# Patient Record
Sex: Female | Born: 1940 | Race: White | Hispanic: No | Marital: Married | Smoking: Former smoker
Health system: Southern US, Community
[De-identification: ages and names within clinical notes are randomized; demographics above are authoritative.]

## PROBLEM LIST (undated history)

## (undated) DIAGNOSIS — R197 Diarrhea, unspecified: Secondary | ICD-10-CM

## (undated) DIAGNOSIS — I480 Paroxysmal atrial fibrillation: Secondary | ICD-10-CM

## (undated) DIAGNOSIS — K529 Noninfective gastroenteritis and colitis, unspecified: Secondary | ICD-10-CM

## (undated) DIAGNOSIS — E876 Hypokalemia: Secondary | ICD-10-CM

## (undated) DIAGNOSIS — F1010 Alcohol abuse, uncomplicated: Secondary | ICD-10-CM

## (undated) DIAGNOSIS — F419 Anxiety disorder, unspecified: Secondary | ICD-10-CM

## (undated) DIAGNOSIS — I10 Essential (primary) hypertension: Secondary | ICD-10-CM

## (undated) DIAGNOSIS — I493 Ventricular premature depolarization: Secondary | ICD-10-CM

## (undated) DIAGNOSIS — E785 Hyperlipidemia, unspecified: Secondary | ICD-10-CM

## (undated) DIAGNOSIS — I491 Atrial premature depolarization: Secondary | ICD-10-CM

## (undated) DIAGNOSIS — K52831 Collagenous colitis: Secondary | ICD-10-CM

## (undated) DIAGNOSIS — M1990 Unspecified osteoarthritis, unspecified site: Secondary | ICD-10-CM

## (undated) HISTORY — DX: Ventricular premature depolarization: I49.3

## (undated) HISTORY — DX: Essential (primary) hypertension: I10

## (undated) HISTORY — DX: Alcohol abuse, uncomplicated: F10.10

## (undated) HISTORY — DX: Noninfective gastroenteritis and colitis, unspecified: K52.9

## (undated) HISTORY — PX: ABDOMINAL HYSTERECTOMY: SHX81

## (undated) HISTORY — DX: Collagenous colitis: K52.831

## (undated) HISTORY — PX: CHOLECYSTECTOMY: SHX55

## (undated) HISTORY — DX: Diarrhea, unspecified: R19.7

## (undated) HISTORY — DX: Unspecified osteoarthritis, unspecified site: M19.90

## (undated) HISTORY — PX: VAGINAL HYSTERECTOMY: SHX2639

## (undated) HISTORY — DX: Hypokalemia: E87.6

## (undated) HISTORY — DX: Atrial premature depolarization: I49.1

## (undated) HISTORY — DX: Paroxysmal atrial fibrillation: I48.0

## (undated) HISTORY — DX: Hyperlipidemia, unspecified: E78.5

## (undated) HISTORY — DX: Anxiety disorder, unspecified: F41.9

---

## 2000-01-21 ENCOUNTER — Emergency Department (HOSPITAL_COMMUNITY): Admission: EM | Admit: 2000-01-21 | Discharge: 2000-01-21 | Payer: Self-pay | Admitting: Emergency Medicine

## 2000-05-22 ENCOUNTER — Encounter: Payer: Self-pay | Admitting: Family Medicine

## 2000-05-22 ENCOUNTER — Encounter: Admission: RE | Admit: 2000-05-22 | Discharge: 2000-05-22 | Payer: Self-pay | Admitting: Family Medicine

## 2001-07-23 ENCOUNTER — Encounter: Payer: Self-pay | Admitting: Internal Medicine

## 2001-08-16 ENCOUNTER — Emergency Department (HOSPITAL_COMMUNITY): Admission: EM | Admit: 2001-08-16 | Discharge: 2001-08-16 | Payer: Self-pay | Admitting: Emergency Medicine

## 2002-02-22 ENCOUNTER — Encounter: Payer: Self-pay | Admitting: Family Medicine

## 2002-02-22 ENCOUNTER — Encounter: Admission: RE | Admit: 2002-02-22 | Discharge: 2002-02-22 | Payer: Self-pay | Admitting: Family Medicine

## 2002-07-10 ENCOUNTER — Emergency Department (HOSPITAL_COMMUNITY): Admission: EM | Admit: 2002-07-10 | Discharge: 2002-07-10 | Payer: Self-pay | Admitting: Emergency Medicine

## 2003-11-11 ENCOUNTER — Ambulatory Visit (HOSPITAL_COMMUNITY): Admission: RE | Admit: 2003-11-11 | Discharge: 2003-11-11 | Payer: Self-pay | Admitting: *Deleted

## 2008-07-18 ENCOUNTER — Emergency Department (HOSPITAL_COMMUNITY): Admission: EM | Admit: 2008-07-18 | Discharge: 2008-07-19 | Payer: Self-pay | Admitting: Emergency Medicine

## 2008-07-25 ENCOUNTER — Encounter: Payer: Self-pay | Admitting: Internal Medicine

## 2009-06-19 ENCOUNTER — Encounter: Payer: Self-pay | Admitting: Internal Medicine

## 2009-06-19 ENCOUNTER — Observation Stay (HOSPITAL_COMMUNITY): Admission: EM | Admit: 2009-06-19 | Discharge: 2009-06-20 | Payer: Self-pay | Admitting: Emergency Medicine

## 2009-06-27 ENCOUNTER — Encounter: Payer: Self-pay | Admitting: Internal Medicine

## 2011-04-01 LAB — CBC
HCT: 39.8 % (ref 36.0–46.0)
Hemoglobin: 14 g/dL (ref 12.0–15.0)
MCHC: 35.2 g/dL (ref 30.0–36.0)
MCV: 95.1 fL (ref 78.0–100.0)
MCV: 95.2 fL (ref 78.0–100.0)
Platelets: 177 10*3/uL (ref 150–400)
Platelets: 192 10*3/uL (ref 150–400)
RBC: 4.19 MIL/uL (ref 3.87–5.11)
RDW: 12.4 % (ref 11.5–15.5)
RDW: 12.5 % (ref 11.5–15.5)
WBC: 4.4 10*3/uL (ref 4.0–10.5)
WBC: 4.6 10*3/uL (ref 4.0–10.5)

## 2011-04-01 LAB — DIFFERENTIAL
Basophils Absolute: 0 10*3/uL (ref 0.0–0.1)
Basophils Relative: 0 % (ref 0–1)
Eosinophils Absolute: 0.2 10*3/uL (ref 0.0–0.7)
Eosinophils Relative: 3 % (ref 0–5)
Lymphocytes Relative: 32 % (ref 12–46)
Lymphs Abs: 1.5 10*3/uL (ref 0.7–4.0)
Monocytes Absolute: 0.3 10*3/uL (ref 0.1–1.0)
Monocytes Relative: 7 % (ref 3–12)
Neutro Abs: 2.7 10*3/uL (ref 1.7–7.7)
Neutrophils Relative %: 57 % (ref 43–77)

## 2011-04-01 LAB — COMPREHENSIVE METABOLIC PANEL
AST: 20 U/L (ref 0–37)
Albumin: 3.5 g/dL (ref 3.5–5.2)
Chloride: 106 mEq/L (ref 96–112)
Creatinine, Ser: 0.56 mg/dL (ref 0.4–1.2)
GFR calc Af Amer: >60 mL/min (ref >60)
Potassium: 3.9 mEq/L (ref 3.5–5.1)
Total Bilirubin: 0.7 mg/dL (ref 0.3–1.2)
Total Protein: 5.7 g/dL — ABNORMAL LOW (ref 6.0–8.3)

## 2011-04-01 LAB — LIPID PANEL
Cholesterol: 233 mg/dL — ABNORMAL HIGH (ref 0–200)
HDL: 69 mg/dL (ref >39)

## 2011-04-01 LAB — POCT I-STAT, CHEM 8
Hemoglobin: 13.9 g/dL (ref 12.0–15.0)
Sodium: 137 mEq/L (ref 135–145)
TCO2: 26 mmol/L (ref 0–100)

## 2011-04-01 LAB — CARDIAC PANEL(CRET KIN+CKTOT+MB+TROPI)
CK, MB: 1.1 ng/mL (ref 0.3–4.0)
Relative Index: INVALID (ref 0.0–2.5)
Total CK: 34 U/L (ref 7–177)
Troponin I: 0.02 ng/mL (ref 0.00–0.06)

## 2011-04-01 LAB — POCT CARDIAC MARKERS: Myoglobin, poc: 54.9 ng/mL (ref 12–200)

## 2011-04-01 LAB — APTT: aPTT: 28 seconds (ref 24–37)

## 2011-04-01 LAB — TSH
TSH: 3.048 u[IU]/mL (ref 0.350–4.500)
TSH: 4.184 u[IU]/mL (ref 0.350–4.500)

## 2011-04-01 LAB — TROPONIN I: Troponin I: 0.02 ng/mL (ref 0.00–0.06)

## 2011-04-01 LAB — CK TOTAL AND CKMB (NOT AT ARMC)
Relative Index: INVALID (ref 0.0–2.5)
Total CK: 49 U/L (ref 7–177)

## 2011-04-01 LAB — PROTIME-INR: Prothrombin Time: 14 seconds (ref 11.6–15.2)

## 2011-04-01 LAB — MAGNESIUM: Magnesium: 2.3 mg/dL (ref 1.5–2.5)

## 2011-05-07 ENCOUNTER — Telehealth: Payer: Self-pay | Admitting: Gastroenterology

## 2011-05-07 ENCOUNTER — Ambulatory Visit: Payer: Self-pay | Admitting: Gastroenterology

## 2011-05-07 NOTE — Telephone Encounter (Signed)
Gwendolyn [name] patient for no show today .

## 2011-05-07 NOTE — H&P (Signed)
NAMEDANISA, KOPEC                ACCOUNT NO.:  [id number]   MEDICAL RECORD NO.:  [id number]          PATIENT TYPE:  EMS   LOCATION:  [facility]                         FACILITY:  [facility]   PHYSICIAN:  Michiel Cowboy, MDDATE OF BIRTH:  01-29-1941   DATE OF ADMISSION:  06/19/2009  DATE OF DISCHARGE:                              HISTORY & PHYSICAL   PRIMARY CARE Ellawyn Wogan:  Dr. Abigail Miyamoto.   CARDIOLOGIST:  Dr. Amil Amen.   CHIEF COMPLAINT:  Abnormal EKG.   The patient is a 70 year old female with past medical history  significant for atrial fibrillation, hypertension and hyperlipidemia.  The patient was evaluated today in the office prior to initiation of an  exercise program.  As part of this evaluation, she had undergone an EKG  that showed sinus bradycardia with frequent PACs and heart rate of 56.  Her primary care Sahaana Weitman was somewhat concerned because the patient  states that she is kind of feeling slightly lightheaded and not quite  herself and is having palpitations.  She called the cardiology office,  who recommended further monitoring at [place] the patient was sent  down to the emergency department.  ED called the hospitalist on-call for  admission.  The patient states that she has always been lightheaded,  particularly when she gets up, but she has been having trouble with  palpitations almost daily.  She has been on this dose of Toprol for a  prolonged period time and she says that if it is changed she usually  develops paroxysmal atrial fibrillation.   Otherwise, review of systems completely unremarkable.  She has not had  any chest pain, no shortness of breath, no wheezing, no coughing.  Otherwise, she has been feeling fairly well.   Review of systems otherwise unremarkable.   PAST MEDICAL HISTORY:  1. Paroxysmal atrial fibrillation.  2. Hypertension.  3. Chronic anxiety.  4. Hyperlipidemia, diet controlled.   SOCIAL HISTORY:  The patient does not smoke, drinks  occasionally.   FAMILY HISTORY:  Noncontributory.   ALLERGIES:  1. CODEINE.  2. ERYTHROMYCIN.  3. PENICILLIN.  4. SULFA.   MEDICATIONS:  1. Toprol XL 400 mg once a day.  2. Triamterene 37.5/25 mg p.o. daily.  3. Folic acid daily.  4. Aspirin 81 mg daily.  5. Potassium 10 mEq daily.   PHYSICAL EXAMINATION:  VITAL SIGNS:  Temperature 97.1, blood pressure  132/57, pulse 60, respirations 16.  Saturating 100% on room air.  GENERAL:  The patient appears to be in no acute distress.  HEENT:  Head nontraumatic.  Moist mucous membranes.  LUNGS:  Clear to auscultation bilaterally.  HEART:  Regular rate and rhythm.  No murmurs, rubs or gallops.  ABDOMEN:  Soft, nontender, nondistended.  LOWER EXTREMITIES:  Without clubbing, cyanosis or edema.  NEUROLOGIC:  The patient is intact.  SKIN:  Clean, dry and intact.   LABORATORY DATA:  White blood cell count 4.6, hemoglobin 13.9, sodium  137, potassium 3.6, creatinine 0.71.  Cardiac enzymes negative.  Chest x-  ray unremarkable.  EKG showing sinus bradycardia with frequent PACs,  rate 58.  ASSESSMENT/PLAN:  This is a 70 year old female with history of  paroxysmal atrial fibrillation, currently in sinus brady with  symptomatic PACs.   1. History of paroxysmal atrial fibrillation.  Continue her Toprol.      She says that Dr. Amil Amen advised against changing the doses and      she responds poorly to adjustment.  So far, I do not think she is      having symptomatology from bradycardia, but rather the occasional      PACs.  We will continue to watch her on telemetry.  Check TSH.  We      will recommend a cardiology consult to see what else they want to      do to the patient while she is here.  She may need an outpatient      Holter monitor.  Reportedly, she had an echogram done just in      December and will not repeat at this point.  2. History of hypertension.  Continue triamterine and      hydrochlorothiazide.  3. Sensation of  lightheadedness when she stands up.  We will check      orthostatics.  Also sensation of overall being unwell and frequent      palpitations.  Watch in telemetry and cycle cardiac markers.  4. Prophylaxis.  Lovenox, good p.o. intake.  The patient is going to      be observed for 24 hours.  5. Low potassium.  We will replace up to 4.0.      Michiel Cowboy, MD  Electronically Signed     AVD/MEDQ  D:  06/19/2009  T:  06/20/2009  Job:  [id number]   cc:   Chales Salmon. Abigail Miyamoto, M.D.

## 2011-06-13 ENCOUNTER — Encounter: Payer: Self-pay | Admitting: Gastroenterology

## 2011-06-13 ENCOUNTER — Other Ambulatory Visit (INDEPENDENT_AMBULATORY_CARE_PROVIDER_SITE_OTHER): Payer: Medicare Other

## 2011-06-13 ENCOUNTER — Ambulatory Visit (INDEPENDENT_AMBULATORY_CARE_PROVIDER_SITE_OTHER): Payer: Medicare Other | Admitting: Gastroenterology

## 2011-06-13 VITALS — BP 128/64 | HR 80 | Ht 65.0 in | Wt 173.0 lb

## 2011-06-13 DIAGNOSIS — R195 Other fecal abnormalities: Secondary | ICD-10-CM

## 2011-06-13 DIAGNOSIS — R6889 Other general symptoms and signs: Secondary | ICD-10-CM

## 2011-06-13 DIAGNOSIS — D509 Iron deficiency anemia, unspecified: Secondary | ICD-10-CM | POA: Insufficient documentation

## 2011-06-13 DIAGNOSIS — F341 Dysthymic disorder: Secondary | ICD-10-CM

## 2011-06-13 DIAGNOSIS — R197 Diarrhea, unspecified: Secondary | ICD-10-CM | POA: Insufficient documentation

## 2011-06-13 DIAGNOSIS — F329 Major depressive disorder, single episode, unspecified: Secondary | ICD-10-CM

## 2011-06-13 LAB — CBC WITH DIFFERENTIAL/PLATELET
Basophils Relative: 0.6 % (ref 0.0–3.0)
Eosinophils Absolute: 0.2 10*3/uL (ref 0.0–0.7)
Eosinophils Relative: 3.5 % (ref 0.0–5.0)
Hemoglobin: 13.6 g/dL (ref 12.0–15.0)
Lymphocytes Relative: 19.6 % (ref 12.0–46.0)
MCHC: 34.6 g/dL (ref 30.0–36.0)
Monocytes Relative: 8.1 % (ref 3.0–12.0)
Neutro Abs: 3.5 10*3/uL (ref 1.4–7.7)
Neutrophils Relative %: 68.2 % (ref 43.0–77.0)
RBC: 4.13 Mil/uL (ref 3.87–5.11)
WBC: 5.1 10*3/uL (ref 4.5–10.5)

## 2011-06-13 LAB — HEPATIC FUNCTION PANEL
ALT: 31 U/L (ref 0–35)
Albumin: 3.7 g/dL (ref 3.5–5.2)
Bilirubin, Direct: 0.2 mg/dL (ref 0.0–0.3)
Total Protein: 6 g/dL (ref 6.0–8.3)

## 2011-06-13 LAB — BASIC METABOLIC PANEL
CO2: 30 mEq/L (ref 19–32)
Calcium: 9 mg/dL (ref 8.4–10.5)
Creatinine, Ser: 0.4 mg/dL (ref 0.4–1.2)
GFR: 163.64 mL/min (ref >60.00)
Glucose, Bld: 98 mg/dL (ref 70–99)
Sodium: 142 mEq/L (ref 135–145)

## 2011-06-13 LAB — VITAMIN B12: Vitamin B-12: 229 pg/mL (ref 211–911)

## 2011-06-13 LAB — FOLATE: Folate: >24.8 ng/mL (ref >5.9)

## 2011-06-13 MED ORDER — PEG-KCL-NACL-NASULF-NA ASC-C 100 G PO SOLR: 1.0000 | Freq: Once | ORAL | Status: DC

## 2011-06-13 MED ORDER — COLESTIPOL HCL 1 G PO TABS: 1.0000 g | ORAL_TABLET | Freq: Every day | ORAL | Status: DC

## 2011-06-13 MED ORDER — MESALAMINE 1.2 G PO TBEC: DELAYED_RELEASE_TABLET | ORAL | Status: DC

## 2011-06-13 NOTE — Progress Notes (Signed)
History of Present Illness:  This is a very pleasant 71 year old Caucasian female referred by Dr.Nnodi Eagle family care at [facility] area at this patient has had 2-1/2 months of watery diarrhea without rectal bleeding or significant abdominal pain. Her bowel movements are urgent and explosive and do occur at night. In her mind, her diarrhea is related to eating Mussels. However, there is no known history of food poisoning and other family members or friends. Patient is status post cholecystectomy and has had some mild diarrhea for years. Also noted, is the recent death of her husband about the time her diarrhea started. She has had some mild depression but no hallucinations or delusions. He denies upper GI complaints, but is on daily aspirin therapy. There is no history of hepatitis, pancreatitis, or hepatobiliary symptoms. Over the last 10 years the patient has voluntarily lost 70 pounds in weight .however, she denies any specific food intolerances. There is no family history of celiac disease. Also, the patient does complain of gas, bloating, and on questioning uses a large amount of by mouth sorbitol. Past medical history is remarkable for atrial fibrillation managed with beta blocker therapy. The patient denies infectious disease exposure or recent antibiotics. She has not had previous colonoscopy or endoscopy exams.    I have reviewed this patient's present history, medical and surgical past history, allergies and medications.     ROS: The remainder of the 10 point ROS is negative.. she does complain of low-grade fever, recent anxiety and depression, chronic low back pain, periodic palpitations, night sweats, chronic insomnia, and nonspecific edema of her lower tremors. She recently was found to be potassium deficient and has been started on potassium replacement therapy.   Past Medical History  Diagnosis Date  . Hypertension   . Afib   . PVC's (premature ventricular contractions)   . PAC  (premature atrial contraction)   . Anxiety   . Alcohol abuse   . Diarrhea   . Hypopotassemia   . Hyperlipemia    Past Surgical History  Procedure Date  . Cholecystectomy   . Vaginal hysterectomy     reports that she has quit smoking. She has never used smokeless tobacco. She reports that she drinks alcohol. She reports that she does not use illicit drugs. family history includes Heart disease in her maternal grandmother.  There is no history of Colon cancer. Allergies  Allergen Reactions  . Codeine   . Erythromycin   . Penicillins   . Sulfa Antibiotics       Physical Exam: General well developed well nourished patient in no acute distress, appearing her stated age Eyes PERRLA, no icterus, fundoscopic exam per opthamologist Skin no lesions noted Neck supple, no adenopathy, no thyroid enlargement, no tenderness Chest clear to percussion and auscultation Heart no significant murmurs, gallops or rubs noted Abdomen no hepatosplenomegaly masses or tenderness, BS normal.  Rectal inspection normal no fissures, or fistulae noted.  No masses or tenderness on digital exam. Stool guaiac +. Neurologic patient oriented x 3, cranial nerves intact, no focal neurologic deficits noted. Psychological mental status normal and normal affect.  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 This sounds like rather classical irritable bowel syndrome him a that patient does have a guaiac positive stool suggestive of underlying inflammatory colitis. I have scheduled her for diagnostic colonoscopy after we have checked stool culture, C. difficile  exam, exam for O&P, and repeat screening labs including celiac antibodies. I have placed her on aminosalicylate therapy with Lialda 2.4 g a day, Colestid 1 g in mid morning, and a low fiber diet. Anemia profile  also ordered today.  Encounter Diagnoses  Name Primary?  . Diarrhea Yes  . Iron deficiency anemia, unspecified    . Other general symptoms

## 2011-06-13 NOTE — Patient Instructions (Addendum)
Your prescription(s) have been sent to you pharmacy.  Your procedure has been scheduled for 07/22/2011, please follow the seperate instructions.  Please go to the basement today for your labs.  Today you were given an handout on artificial sweeteners to avoid please follow. Low fiber diet given today

## 2011-06-14 ENCOUNTER — Telehealth: Payer: Self-pay | Admitting: *Deleted

## 2011-06-14 LAB — CELIAC PANEL 10
Endomysial Screen: NEGATIVE
Gliadin IgA: 2.9 U/mL (ref <20)
Gliadin IgG: 4.2 U/mL (ref <20)
Tissue Transglut Ab: 5.9 U/mL (ref <20)
Tissue Transglutaminase Ab, IgA: 4.7 U/mL (ref <20)

## 2011-06-14 NOTE — Telephone Encounter (Signed)
Message copied by Leonette Monarch on Fri Jun 14, 2011 12:21 PM ------      Message from: Jarold Motto, DAVID R      Created: Fri Jun 14, 2011 10:35 AM       Restart B12 shots and treatment ASAP.

## 2011-06-17 ENCOUNTER — Other Ambulatory Visit: Payer: Medicare Other

## 2011-06-17 ENCOUNTER — Other Ambulatory Visit: Payer: Self-pay | Admitting: Gastroenterology

## 2011-06-17 ENCOUNTER — Ambulatory Visit (INDEPENDENT_AMBULATORY_CARE_PROVIDER_SITE_OTHER): Payer: Medicare Other | Admitting: Gastroenterology

## 2011-06-17 DIAGNOSIS — E538 Deficiency of other specified B group vitamins: Secondary | ICD-10-CM

## 2011-06-17 DIAGNOSIS — D509 Iron deficiency anemia, unspecified: Secondary | ICD-10-CM

## 2011-06-17 DIAGNOSIS — R197 Diarrhea, unspecified: Secondary | ICD-10-CM

## 2011-06-17 MED ORDER — CYANOCOBALAMIN 1000 MCG/ML IJ SOLN
1000.0000 ug | INTRAMUSCULAR | Status: AC
  Administered 2011-08-06 – 2012-06-16 (×9): 1000 ug via INTRAMUSCULAR

## 2011-06-17 MED ORDER — CYANOCOBALAMIN 1000 MCG/ML IJ SOLN
1000.0000 ug | INTRAMUSCULAR | Status: AC
  Administered 2011-06-17 – 2011-07-03 (×3): 1000 ug via INTRAMUSCULAR

## 2011-06-17 NOTE — Telephone Encounter (Signed)
Pt aware and she would like to be added for b12 today

## 2011-06-17 NOTE — Progress Notes (Signed)
Patient here for a B12 injection

## 2011-06-19 LAB — CLOSTRIDIUM DIFFICILE BY PCR: Toxigenic C. Difficile by PCR: NOT DETECTED

## 2011-06-24 ENCOUNTER — Ambulatory Visit (INDEPENDENT_AMBULATORY_CARE_PROVIDER_SITE_OTHER): Payer: Medicare Other | Admitting: Gastroenterology

## 2011-06-24 DIAGNOSIS — E538 Deficiency of other specified B group vitamins: Secondary | ICD-10-CM

## 2011-06-28 ENCOUNTER — Telehealth: Payer: Self-pay | Admitting: Gastroenterology

## 2011-06-28 NOTE — Telephone Encounter (Signed)
lmom for pt to call back. Stools for CDIFF, O&P, and CULTURE were all negative. She may call back if she still has problems.

## 2011-07-03 ENCOUNTER — Ambulatory Visit (INDEPENDENT_AMBULATORY_CARE_PROVIDER_SITE_OTHER): Payer: Medicare Other | Admitting: Gastroenterology

## 2011-07-03 DIAGNOSIS — E538 Deficiency of other specified B group vitamins: Secondary | ICD-10-CM

## 2011-07-03 NOTE — Telephone Encounter (Signed)
Pt did not return call; assume she got my message with results.

## 2011-07-22 ENCOUNTER — Encounter: Payer: Self-pay | Admitting: Gastroenterology

## 2011-07-22 ENCOUNTER — Ambulatory Visit (AMBULATORY_SURGERY_CENTER): Payer: Medicare Other | Admitting: Gastroenterology

## 2011-07-22 DIAGNOSIS — K5730 Diverticulosis of large intestine without perforation or abscess without bleeding: Secondary | ICD-10-CM

## 2011-07-22 DIAGNOSIS — R195 Other fecal abnormalities: Secondary | ICD-10-CM

## 2011-07-22 DIAGNOSIS — K5289 Other specified noninfective gastroenteritis and colitis: Secondary | ICD-10-CM

## 2011-07-22 DIAGNOSIS — R197 Diarrhea, unspecified: Secondary | ICD-10-CM

## 2011-07-22 DIAGNOSIS — D509 Iron deficiency anemia, unspecified: Secondary | ICD-10-CM

## 2011-07-22 MED ORDER — SODIUM CHLORIDE 0.9 % IV SOLN: 500.0000 mL | INTRAVENOUS | Status: DC

## 2011-07-22 NOTE — Progress Notes (Signed)
PATIENT VERY ANXIOUS ABOUT EXAM D/T HER A FIB HISTORY. NATALIE,RN NOTIFIED. Sherren Kerns

## 2011-07-22 NOTE — Patient Instructions (Signed)
Blue and Green Discharge instructions reviewed with patient and care partner.  Blue discharge instructions signed by care partner.  Impressions/Recommendations:  Diverticulosis (handout given as well as high fiber diet handout)  Continue medications as you were taking them prior to coming in for your procedure.  Your heart rhythm was irregular at times today, as per your request, an appointment was made for with Dr. Hillis Range, [facility] on August 15th at 1200. You should request any prior cardiology records to be sent to his office prior to your appointment.

## 2011-07-23 ENCOUNTER — Other Ambulatory Visit: Payer: Self-pay

## 2011-07-23 ENCOUNTER — Telehealth: Payer: Self-pay

## 2011-07-23 DIAGNOSIS — R197 Diarrhea, unspecified: Secondary | ICD-10-CM

## 2011-07-23 MED ORDER — DIPHENOXYLATE-ATROPINE 2.5-0.025 MG PO TABS: 1.0000 | ORAL_TABLET | Freq: Three times a day (TID) | ORAL | Status: DC | PRN

## 2011-07-23 NOTE — Telephone Encounter (Signed)
I left a message on the pt's hm am to call if any questions or concerns.  MAW

## 2011-07-24 ENCOUNTER — Telehealth: Payer: Self-pay | Admitting: Gastroenterology

## 2011-07-24 NOTE — Telephone Encounter (Signed)
Notified pt Gwendolyn [name] wants her to follow a Low Fiber Diet; pt stated understanding.

## 2011-07-24 NOTE — Telephone Encounter (Signed)
OV 06/13/11 for diarrhea > 2 months- worse since her husband died. Hx Iron Def. Anemia, mild bile salt enteropathy from previous cholecystectomy. Pt placed on low fiber diet, instructed to avoid artificial sweeteners.She was placed on Lialda and Colestid. Pt had COLON 07/22/11 showing mild diverticulosis- path not back, stools and CDiff negative. Pt instructed to be on a High Fiber diet. Pt reports she still has 3-4 loose, watery stools daily. She stated she never went on the Lialda because of the side effects and she never started Colestid because her PCP has her on MOTOFEN for diarrhea. What diet does she need to be on? Thanks.

## 2011-07-24 NOTE — Telephone Encounter (Signed)
LOW FIBER DIET

## 2011-07-26 ENCOUNTER — Encounter: Payer: Self-pay | Admitting: Gastroenterology

## 2011-07-26 ENCOUNTER — Telehealth: Payer: Self-pay | Admitting: *Deleted

## 2011-07-26 MED ORDER — BUDESONIDE 3 MG PO CP24: ORAL_CAPSULE | ORAL | Status: DC

## 2011-07-26 NOTE — Telephone Encounter (Signed)
Message copied by Florene Glen on Fri Jul 26, 2011  4:36 PM ------      Message from: PATTERSON, DAVID R      Created: Fri Jul 26, 2011  4:32 PM       She has collagenous colitis and we need to start Entocort 9 mg a day ASAP and see me in one month.

## 2011-07-26 NOTE — Telephone Encounter (Signed)
Message copied by Florene Glen on Fri Jul 26, 2011  4:35 PM ------      Message from: PATTERSON, DAVID R      Created: Fri Jul 26, 2011  4:32 PM       She has collagenous colitis and we need to start Entocort 9 mg a day ASAP and see me in one month.

## 2011-07-26 NOTE — Telephone Encounter (Addendum)
lmom that per Dr Jarold Motto, she has Collangenous Colitis. She needs to stop other drugs- Lilada and Colestid- and begin this. (per my note from yesterday, pt did not start these drugs). She needs to start Entocort 3 tabs daily until she sees Dr Jarold Motto in 1 month. I will call her with an appt on Monday. Scheduled pt's appt for 08/23/11 at 09:45am.

## 2011-07-29 NOTE — Telephone Encounter (Signed)
Spoke with pt who stated she got my message and the drug. Wants to make sure it won't interfere with her AFib. Called pharmacist at [facility] who could find no interaction between Metoprolol and Entocort. Informed  Pt of findings and changed her f/u appt d/t her being out of town. Pt to call with problems.

## 2011-08-06 ENCOUNTER — Ambulatory Visit (INDEPENDENT_AMBULATORY_CARE_PROVIDER_SITE_OTHER): Payer: Medicare Other | Admitting: Gastroenterology

## 2011-08-06 DIAGNOSIS — E538 Deficiency of other specified B group vitamins: Secondary | ICD-10-CM

## 2011-08-07 ENCOUNTER — Ambulatory Visit (INDEPENDENT_AMBULATORY_CARE_PROVIDER_SITE_OTHER): Payer: Medicare Other | Admitting: Internal Medicine

## 2011-08-07 ENCOUNTER — Encounter: Payer: Self-pay | Admitting: Internal Medicine

## 2011-08-07 VITALS — BP 118/66 | HR 63 | Ht 65.0 in | Wt 169.0 lb

## 2011-08-07 DIAGNOSIS — I10 Essential (primary) hypertension: Secondary | ICD-10-CM

## 2011-08-07 DIAGNOSIS — I4949 Other premature depolarization: Secondary | ICD-10-CM

## 2011-08-07 DIAGNOSIS — I4891 Unspecified atrial fibrillation: Secondary | ICD-10-CM | POA: Insufficient documentation

## 2011-08-07 DIAGNOSIS — I493 Ventricular premature depolarization: Secondary | ICD-10-CM | POA: Insufficient documentation

## 2011-08-07 NOTE — Assessment & Plan Note (Signed)
Controlled with toprol We will obtain an echo

## 2011-08-07 NOTE — Progress Notes (Signed)
Gwendolyn [name] is a pleasant 70 y.o. WF patient with a h/o paroxysmal atrial fibrillation who presents today to establish care.  She reports initially having atrial fibrillation 15 years ago.  She did well without recurrence until 3 years ago when she had a second episode of atrial fibrillation 7/09.  She states that her spouse was recovering from knee surgery.  In the setting of the stress of taking care of him she developed atrial fibrillation.  She reports associated SOB, anxiety, and "heart racing".   She went to [facility] ER and was observed.  Her afib spontaneously resolved.  She has been treated with toprol.  She thinks that she takes toprol XL 200mg  BID.  This is simliar to the dose she was taking on a H&P from 2010. She presented to her primary care physicians office 6/10 with bradycardia and PACs.   She was observed at [facility] and remained stable/ asymptomatic.  No changes were made at that time.  She has done well since.  She reports occasional palpitations which she attributes to PVCs.  Today, she denies symptoms of chest pain, fatigue, shortness of breath, orthopnea, PND, lower extremity edema,  presyncope, syncope, or neurologic sequela.  She has rare dizziness. The patient is tolerating medications without difficulties and is otherwise without complaint today.   Past Medical History  Diagnosis Date  . Hypertension   . Paroxysmal atrial fibrillation   . PVC's (premature ventricular contractions)   . PAC (premature atrial contraction)   . Anxiety   . Alcohol abuse   . Diarrhea   . Hypopotassemia   . Hyperlipemia   . Colitis   . DJD (degenerative joint disease)    Past Surgical History  Procedure Date  . Cholecystectomy   . Vaginal hysterectomy     Current Outpatient Prescriptions  Medication Sig Dispense Refill  . aspirin 81 MG tablet Take 81 mg by mouth daily.        . budesonide (ENTOCORT EC) 3 MG 24 hr capsule Take 3 pills daily by mouth daily.  90 capsule  0  .  Cholecalciferol (VITAMIN D) 1000 UNITS capsule Take 1,000 Units by mouth daily.        . Difenoxin-Atropine (MOTOFEN) 1-0.025 MG TABS Take one tablet by mouth after each loose stool every 3 hours as needed.       . diphenoxylate-atropine (LOMOTIL) 2.5-0.025 MG per tablet Take 1 tablet by mouth every 8 (eight) hours as needed for diarrhea/loose stools.  35 tablet  0  . folic acid (FOLVITE) 1 MG tablet Take 1 mg by mouth daily.        . magnesium oxide (MAG-OX) 400 MG tablet Take 400 mg by mouth daily.        . metoprolol (TOPROL-XL) 200 MG 24 hr tablet Take two tablets by mouth once a day       . potassium chloride (KLOR-CON) 10 MEQ CR tablet Take 20 mEq by mouth daily.       Marland Kitchen triamterene-hydrochlorothiazide (DYAZIDE) 37.5-25 MG per capsule Take 1 capsule by mouth every morning.        . vitamin C (ASCORBIC ACID) 500 MG tablet Take 500 mg by mouth daily.         Current Facility-Administered Medications  Medication Dose Route Frequency Provider Last Rate Last Dose  . 0.9 %  sodium chloride infusion  500 mL Intravenous Continuous Sheryn Bison, MD      . cyanocobalamin ((VITAMIN B-12)) injection 1,000 mcg  1,000  mcg Intramuscular Q30 days Sheryn Bison, MD   1,000 mcg at 08/06/11 1203    Allergies  Allergen Reactions  . Codeine   . Erythromycin   . Other     ALL MYCINS  . Penicillins   . Sulfa Antibiotics     History   Social History  . Marital Status: Married    Spouse Name: N/A    Number of Children: 2  . Years of Education: N/A   Occupational History  . retired    Social History Main Topics  . Smoking status: Former Smoker    Quit date: 08/06/1978  . Smokeless tobacco: Never Used  . Alcohol Use: 0.0 oz/week     2 drinks WINE daily, per Dr Amil Amen office note, prior heavy ETOH use  . Drug Use: No  . Sexually Active: Not on file   Other Topics Concern  . Not on file   Social History Narrative   Lives in [place].  Widowed x 3 months.    Family History    Problem Relation Age of Onset  . Colon cancer Neg Hx   . Heart disease Maternal Grandmother     ROS- All systems are reviewed and negative except as per the HPI above  Physical Exam: Filed Vitals:   08/07/11 1238  BP: 118/66  Pulse: 63  Height: 5\' 5"  (1.651 m)  Weight: 169 lb (76.658 kg)    GEN- The patient is well appearing, alert and oriented x 3 today.   Head- normocephalic, atraumatic Eyes-  Sclera clear, conjunctiva pink Ears- hearing intact Oropharynx- clear Neck- supple, no JVP Lymph- no cervical lymphadenopathy Lungs- Clear to ausculation bilaterally, normal work of breathing Heart- Regular rate and rhythm, no murmurs, rubs or gallops, PMI not laterally displaced GI- soft, NT, ND, + BS Extremities- no clubbing, cyanosis, or edema MS- no significant deformity or atrophy Skin- no rash or lesion Psych- euthymic mood, full affect Neuro- strength and sensation are intact  EKG today reveals sinus rhythm 63 bpm, otherwise normal ekg  GXT cardiolyte 07/23/01 [facility])- pt walked for 7 minutes 25 seconds, achieved HR 160bpm,  breast attenuation artifact, EF 58%, possible LCx ischemia  Echo 07/25/08- EF 53%, LA 47 mm, no significant valvular disease  Assessment and Plan:

## 2011-08-07 NOTE — Patient Instructions (Addendum)
Your physician has requested that you have an echocardiogram. Echocardiography is a painless test that uses sound waves to create images of your heart. It provides your doctor with information about the size and shape of your heart and how well your heart's chambers and valves are working. This procedure takes approximately one hour. There are no restrictions for this procedure.  Your physician recommends that you schedule a follow-up appointment in: 6 months with Dr. Johney Frame.

## 2011-08-07 NOTE — Assessment & Plan Note (Signed)
Stable No change required today  

## 2011-08-07 NOTE — Assessment & Plan Note (Signed)
Ms Vogelgesang presents today to establish care in the electrophysiology clinic for longterm management of afib. Her afib appears to be well controlled with Toprol XL.  She appears to take 400mg  daily and has taken this dose for a very long time.  I feel that this is an extraordinarily high dose and should be tapered.  She is very clear that she does not wish to taper her toprol at this time.  We will plan to decrease this medicine upon return.  Her CHADSVASC score is at least 50 (female over age 48 with HTN).  She should therefore be anticoagulated with coumadin/ pradaxa/ or xarelto.  She is very clear in her decision to decline these medications and continue ASA at this time.  We will discuss this further upon return.  ETOH cessation is advised.  We will obtain an echo to evaulate her LA size and EF.

## 2011-08-08 ENCOUNTER — Telehealth: Payer: Self-pay | Admitting: Internal Medicine

## 2011-08-08 NOTE — Telephone Encounter (Signed)
Patient calling back with additional information. Will tell inform Tresa Endo when she called back today.

## 2011-08-08 NOTE — Telephone Encounter (Signed)
Patient would like to add to her Past medical history: " Collagenous Colitis" She said Dr. Johney Frame wanted to know.

## 2011-08-15 ENCOUNTER — Encounter: Payer: Self-pay | Admitting: *Deleted

## 2011-08-19 ENCOUNTER — Other Ambulatory Visit: Payer: Self-pay | Admitting: Gastroenterology

## 2011-08-20 ENCOUNTER — Ambulatory Visit (INDEPENDENT_AMBULATORY_CARE_PROVIDER_SITE_OTHER): Payer: Medicare Other | Admitting: Gastroenterology

## 2011-08-20 ENCOUNTER — Encounter: Payer: Self-pay | Admitting: Gastroenterology

## 2011-08-20 VITALS — BP 124/68 | HR 60 | Ht 65.0 in | Wt 168.0 lb

## 2011-08-20 DIAGNOSIS — R197 Diarrhea, unspecified: Secondary | ICD-10-CM

## 2011-08-20 DIAGNOSIS — K52831 Collagenous colitis: Secondary | ICD-10-CM

## 2011-08-20 DIAGNOSIS — K5289 Other specified noninfective gastroenteritis and colitis: Secondary | ICD-10-CM

## 2011-08-20 MED ORDER — MESALAMINE 1.2 G PO TBEC: DELAYED_RELEASE_TABLET | ORAL | Status: DC

## 2011-08-20 MED ORDER — HYOSCYAMINE SULFATE 0.125 MG SL SUBL: 0.1250 mg | SUBLINGUAL_TABLET | SUBLINGUAL | Status: AC | PRN

## 2011-08-20 NOTE — Patient Instructions (Signed)
Levsin and Lialda have been sent to your pharmacy. Take the Levsin as needed for abdominal pain and cramps. Take the Lialda two tablet by mouth every morning. Stop your Entocort.  You can buy Pepto and take ne tablet by mouth every 6-8 hours as needed.  Make a appt to come back and see Gwendolyn [name] in 6 weeks.

## 2011-08-20 NOTE — Progress Notes (Signed)
History of Present Illness: This is a complicated 70 year old patient female with multiple cardiovascular issues including atrial fibrillation, and she is followed by cardiology ,and she also has a history of multiple drug allergies. Because of her chronic diarrhea, she underwent colonoscopy which was unremarkable, but biopsies were consistent with rather classic collagenous colitis. She was placed on Entocort 9 mg a day, and has had no improvement in her diarrhea which is watery ,nonbloody diarrhea without gas, bloating, or abdominal pain. The patient continues to deny NSAID use. She has had a thorough malabsorption and GI workup otherwise without evidence of celiac disease other causes of malabsorption. Patient denies any specific food intolerances. Her diarrhea seemed best controlled by anti-spasmodic without previous response to Lomotil or Imodium allegedly. She is concerned take any medications because of her cardiac problems.  Past Medical History  Diagnosis Date  . Hypertension   . Paroxysmal atrial fibrillation   . PVC's (premature ventricular contractions)   . PAC (premature atrial contraction)   . Anxiety   . Alcohol abuse   . Diarrhea   . Hypopotassemia   . Hyperlipemia   . Colitis   . DJD (degenerative joint disease)    Past Surgical History  Procedure Date  . Cholecystectomy   . Vaginal hysterectomy     reports that she quit smoking about 33 years ago. She has never used smokeless tobacco. She reports that she drinks alcohol. She reports that she does not use illicit drugs. family history includes Heart disease in her maternal grandmother.  There is no history of Colon cancer. Allergies  Allergen Reactions  . Codeine   . Erythromycin   . Other     ALL MYCINS  . Penicillins   . Sulfa Antibiotics        Current Medications, Allergies, Past Medical History, Past Surgical History, Family History and Social History were reviewed in [employer]  record.   Assessment and plan: Collagenous colitis unresponsive to Entocort therapy. We will try Lialda 2.4 g a day with when necessary sublingual Levsin use, and when necessary Pepto-Bismol tablet use. The patient relates that Imodium has not helped her diarrhea in the past. If this does not work we will give her a trial of cholestyramine therapy. She seemed very reluctant to follow my advice, and I will not be surprised if she is noncompliant. She is status post cholecystectomy and may have an element of bile-salt enteropathy.  Please copy her primary care physician, referring physician, and pertinent subspecialists. Encounter Diagnoses  Name Primary?  . Collagenous colitis Yes  . Diarrhea

## 2011-08-23 ENCOUNTER — Ambulatory Visit: Payer: Medicare Other | Admitting: Gastroenterology

## 2011-09-04 ENCOUNTER — Other Ambulatory Visit: Payer: Self-pay | Admitting: Gastroenterology

## 2011-09-05 ENCOUNTER — Other Ambulatory Visit: Payer: Self-pay | Admitting: Gastroenterology

## 2011-09-05 NOTE — Telephone Encounter (Signed)
Patient states that the Theodosia Blender is not working she gets some nausea. She has stopped the Lialda and restarted Entocort and she says that it seems to be working well. She is now asking for an rx of entocort. Ok to change back to [employer], per your last note it was not working.

## 2011-09-06 MED ORDER — BUDESONIDE 3 MG PO CP24: 6.0000 mg | ORAL_CAPSULE | ORAL | Status: DC

## 2011-09-06 NOTE — Telephone Encounter (Signed)
rx sent left message to advise patient

## 2011-09-06 NOTE — Telephone Encounter (Signed)
????,,,  strange...entocort is fine.Marland Kitchen

## 2011-09-09 ENCOUNTER — Other Ambulatory Visit (HOSPITAL_COMMUNITY): Payer: Medicare Other | Admitting: Radiology

## 2011-09-10 ENCOUNTER — Ambulatory Visit (INDEPENDENT_AMBULATORY_CARE_PROVIDER_SITE_OTHER): Payer: Medicare Other | Admitting: Gastroenterology

## 2011-09-10 DIAGNOSIS — E538 Deficiency of other specified B group vitamins: Secondary | ICD-10-CM

## 2011-09-20 LAB — POCT I-STAT, CHEM 8
BUN: 18
Calcium, Ion: 1.12
Creatinine, Ser: 0.8
Hemoglobin: 12.6
TCO2: 27

## 2011-09-20 LAB — POCT CARDIAC MARKERS: Myoglobin, poc: 43.5

## 2011-10-15 ENCOUNTER — Ambulatory Visit (INDEPENDENT_AMBULATORY_CARE_PROVIDER_SITE_OTHER): Payer: Medicare Other | Admitting: Gastroenterology

## 2011-10-15 DIAGNOSIS — E538 Deficiency of other specified B group vitamins: Secondary | ICD-10-CM

## 2011-11-19 ENCOUNTER — Ambulatory Visit: Payer: Medicare Other | Admitting: Gastroenterology

## 2011-11-19 DIAGNOSIS — E538 Deficiency of other specified B group vitamins: Secondary | ICD-10-CM

## 2011-11-19 NOTE — Progress Notes (Signed)
Patient c/o hair loss since being on Entocort and wanted to know if this was a side effect of the medication. I spoke with Dr Jarold Motto and he states that it is a possibility. He looked over patient's chart and states that she showed no response to the entocort but was put on Lialda instead. Patient states that she didn't have a good response to Entocort at first but called the nurse and asked to be put back on it after taking Lialda for a while. She states that she has been on Entocort 6 mg daily. Dr Jarold Motto states that he would like her to stop Entocort due to side effects and start on Welchol 1 tablet daily instead. Patient states that she is not comfortable taking Welchol because she does not know the side effects of it and she has cardiac problems that she is afraid will be exacerbated by medication. I have advised her that she may contact her cardiologist if she would like. She states that she would rather continue the Entocort for now and let us know after she speaks to the cardiologist. Dr Jarold Motto has been advised of the above.

## 2011-12-26 ENCOUNTER — Ambulatory Visit (INDEPENDENT_AMBULATORY_CARE_PROVIDER_SITE_OTHER): Payer: Medicare Other | Admitting: Gastroenterology

## 2011-12-26 DIAGNOSIS — E538 Deficiency of other specified B group vitamins: Secondary | ICD-10-CM | POA: Diagnosis not present

## 2012-01-03 DIAGNOSIS — Z79899 Other long term (current) drug therapy: Secondary | ICD-10-CM | POA: Diagnosis not present

## 2012-01-28 ENCOUNTER — Ambulatory Visit (INDEPENDENT_AMBULATORY_CARE_PROVIDER_SITE_OTHER): Payer: Medicare Other | Admitting: Gastroenterology

## 2012-01-28 DIAGNOSIS — E538 Deficiency of other specified B group vitamins: Secondary | ICD-10-CM

## 2012-02-13 ENCOUNTER — Telehealth: Payer: Self-pay | Admitting: Gastroenterology

## 2012-02-13 NOTE — Telephone Encounter (Signed)
Pt remains on Entocort daily. Last ofc note in Aug., 2012 stated it wasn't working for diarrhea and other s&s, but she reports, one day it just clicked. She has been going to [facility] every 6 weeks and has started to gain weight. She has noticed her ankles have started to swell as well. In the past pt reports taking a "little" lasix which helped. Pt has not been in and I scheduled her for gave her 02/18/12. Can we order a small dose of Lasix prior to her visit? Thanks.

## 2012-02-13 NOTE — Telephone Encounter (Signed)
lmom for pt to call back

## 2012-02-14 NOTE — Telephone Encounter (Signed)
hctz 25 mg/day prn

## 2012-02-14 NOTE — Telephone Encounter (Signed)
No more diuretics needed,,,

## 2012-02-14 NOTE — Telephone Encounter (Signed)
Gwendolyn [name] for pt that Dr Jarold Motto does not want to add anymore diuretics at this time. She may call back for questions.

## 2012-02-14 NOTE — Telephone Encounter (Signed)
Dr Jarold Motto, pt is on Dyazide with 25mg  of HTCZ; OK to order the hctz 25mg  PRN? Thanks.

## 2012-02-17 ENCOUNTER — Encounter: Payer: Self-pay | Admitting: *Deleted

## 2012-02-18 ENCOUNTER — Encounter: Payer: Self-pay | Admitting: Gastroenterology

## 2012-02-18 ENCOUNTER — Ambulatory Visit (INDEPENDENT_AMBULATORY_CARE_PROVIDER_SITE_OTHER): Payer: Medicare Other | Admitting: Gastroenterology

## 2012-02-18 VITALS — BP 128/68 | HR 66 | Ht 65.0 in | Wt 180.0 lb

## 2012-02-18 DIAGNOSIS — K5289 Other specified noninfective gastroenteritis and colitis: Secondary | ICD-10-CM

## 2012-02-18 DIAGNOSIS — E538 Deficiency of other specified B group vitamins: Secondary | ICD-10-CM | POA: Diagnosis not present

## 2012-02-18 DIAGNOSIS — K52831 Collagenous colitis: Secondary | ICD-10-CM

## 2012-02-18 NOTE — Progress Notes (Signed)
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 We have been able to taper her medications to 6 mg a day over the last 6-8 weeks, and she currently is asymptomatic and denies abdominal pain or diarrhea. Review of her medication shows that she is on regular magnesium oxide 400 mg reasons which are unclear. He does use when necessary over-the-counter Imodium, but not recently. She denies use chronically of NSAIDs but does take aspirin 81 mg a day. He denies upper GI, hepatobiliary, or systemic complaints.  Current Medications, Allergies, Past Medical History, Past Surgical History, Family History and Social History were reviewed in [employer] record.  Pertinent Review of Systems Negative Assessment and Plan: Collagenous colitis, currently in remission on tapering doses of Entocort. I've asked her to discontinue magnesium oxide, and to decrease her Entocort to 3 mg a day for   Physical Exam: Healthy-appearing patient in no acute distress. Blood pressure today 128/68 and BMI 29.95. Cannot appreciate stigmata of chronic liver disease. Abdominal exam is unremarkable without evidence of distention, organomegaly, masses or tenderness. Bowel sounds were normal. Mental status is normal.  Assessment and plan: This patient has recurrent collagenous colitis currently in remission on tapering doses of Entocort. She is to decrease her Entocort to 3 mg a day for one month , then stop if tolerated. We will see her on when necessary basis the future as needed. Patient does have a history of multiple drug allergies, and she is to continue to avoid NSAIDs if possible. We have found that the patient is B12 deficient, and she is on parenteral B12 replacement therapy which I advised her to continue. She as for Lasix to use for peripheral edema, and I've asked her to check with her primary care physician per his request. She is already on  Dyazide therapy.  Please copy her primary care physician, referring physician, and pertinent subspecialists.

## 2012-02-18 NOTE — Patient Instructions (Signed)
Decrease your Entocort to one tablet by mouth once a day for one month then stop. Stop Magnesium oxide. Call back as needed.

## 2012-02-21 DIAGNOSIS — Z79899 Other long term (current) drug therapy: Secondary | ICD-10-CM | POA: Diagnosis not present

## 2012-02-21 DIAGNOSIS — I10 Essential (primary) hypertension: Secondary | ICD-10-CM | POA: Diagnosis not present

## 2012-02-21 DIAGNOSIS — E782 Mixed hyperlipidemia: Secondary | ICD-10-CM | POA: Diagnosis not present

## 2012-02-21 DIAGNOSIS — I4891 Unspecified atrial fibrillation: Secondary | ICD-10-CM | POA: Diagnosis not present

## 2012-02-25 ENCOUNTER — Ambulatory Visit (INDEPENDENT_AMBULATORY_CARE_PROVIDER_SITE_OTHER): Payer: Medicare Other | Admitting: Gastroenterology

## 2012-02-25 DIAGNOSIS — E538 Deficiency of other specified B group vitamins: Secondary | ICD-10-CM

## 2012-02-27 DIAGNOSIS — K130 Diseases of lips: Secondary | ICD-10-CM | POA: Diagnosis not present

## 2012-03-04 DIAGNOSIS — L259 Unspecified contact dermatitis, unspecified cause: Secondary | ICD-10-CM | POA: Diagnosis not present

## 2012-03-12 DIAGNOSIS — Z780 Asymptomatic menopausal state: Secondary | ICD-10-CM | POA: Diagnosis not present

## 2012-03-18 DIAGNOSIS — R21 Rash and other nonspecific skin eruption: Secondary | ICD-10-CM | POA: Diagnosis not present

## 2012-03-27 DIAGNOSIS — L738 Other specified follicular disorders: Secondary | ICD-10-CM | POA: Diagnosis not present

## 2012-04-02 DIAGNOSIS — L259 Unspecified contact dermatitis, unspecified cause: Secondary | ICD-10-CM | POA: Diagnosis not present

## 2012-04-02 DIAGNOSIS — D040 Carcinoma in situ of skin of lip: Secondary | ICD-10-CM | POA: Diagnosis not present

## 2012-04-08 ENCOUNTER — Ambulatory Visit (INDEPENDENT_AMBULATORY_CARE_PROVIDER_SITE_OTHER): Payer: Medicare Other | Admitting: Gastroenterology

## 2012-04-08 DIAGNOSIS — E538 Deficiency of other specified B group vitamins: Secondary | ICD-10-CM | POA: Diagnosis not present

## 2012-04-08 DIAGNOSIS — D519 Vitamin B12 deficiency anemia, unspecified: Secondary | ICD-10-CM

## 2012-04-08 DIAGNOSIS — D518 Other vitamin B12 deficiency anemias: Secondary | ICD-10-CM | POA: Diagnosis not present

## 2012-05-12 ENCOUNTER — Ambulatory Visit (INDEPENDENT_AMBULATORY_CARE_PROVIDER_SITE_OTHER): Payer: Medicare Other | Admitting: Gastroenterology

## 2012-05-12 DIAGNOSIS — E538 Deficiency of other specified B group vitamins: Secondary | ICD-10-CM | POA: Diagnosis not present

## 2012-06-16 ENCOUNTER — Ambulatory Visit (INDEPENDENT_AMBULATORY_CARE_PROVIDER_SITE_OTHER): Payer: Medicare Other | Admitting: Gastroenterology

## 2012-06-16 DIAGNOSIS — E538 Deficiency of other specified B group vitamins: Secondary | ICD-10-CM

## 2012-07-15 DIAGNOSIS — D040 Carcinoma in situ of skin of lip: Secondary | ICD-10-CM | POA: Diagnosis not present

## 2012-07-15 DIAGNOSIS — L578 Other skin changes due to chronic exposure to nonionizing radiation: Secondary | ICD-10-CM | POA: Diagnosis not present

## 2012-07-15 DIAGNOSIS — L719 Rosacea, unspecified: Secondary | ICD-10-CM | POA: Diagnosis not present

## 2012-07-17 DIAGNOSIS — E876 Hypokalemia: Secondary | ICD-10-CM | POA: Diagnosis not present

## 2012-07-28 ENCOUNTER — Ambulatory Visit (INDEPENDENT_AMBULATORY_CARE_PROVIDER_SITE_OTHER): Payer: Medicare Other | Admitting: Gastroenterology

## 2012-07-28 DIAGNOSIS — E538 Deficiency of other specified B group vitamins: Secondary | ICD-10-CM

## 2012-07-28 MED ORDER — CYANOCOBALAMIN 1000 MCG/ML IJ SOLN: 1000.0000 ug | INTRAMUSCULAR | Status: AC

## 2012-07-28 MED ORDER — CYANOCOBALAMIN 1000 MCG/ML IJ SOLN
1000.0000 ug | INTRAMUSCULAR | Status: AC
  Administered 2012-07-28 – 2012-11-03 (×2): 1000 ug via INTRAMUSCULAR

## 2012-09-01 ENCOUNTER — Ambulatory Visit (INDEPENDENT_AMBULATORY_CARE_PROVIDER_SITE_OTHER): Payer: Medicare Other | Admitting: Gastroenterology

## 2012-09-01 DIAGNOSIS — E538 Deficiency of other specified B group vitamins: Secondary | ICD-10-CM

## 2012-09-01 MED ORDER — CYANOCOBALAMIN 1000 MCG/ML IJ SOLN
1000.0000 ug | Freq: Once | INTRAMUSCULAR | Status: AC
  Administered 2012-09-01: 1000 ug via INTRAMUSCULAR

## 2012-10-06 ENCOUNTER — Ambulatory Visit (INDEPENDENT_AMBULATORY_CARE_PROVIDER_SITE_OTHER): Payer: Medicare Other | Admitting: Gastroenterology

## 2012-10-06 DIAGNOSIS — E538 Deficiency of other specified B group vitamins: Secondary | ICD-10-CM

## 2012-10-06 MED ORDER — CYANOCOBALAMIN 1000 MCG/ML IJ SOLN
1000.0000 ug | INTRAMUSCULAR | Status: DC
  Administered 2012-10-06: 1000 ug via INTRAMUSCULAR

## 2012-10-15 DIAGNOSIS — B009 Herpesviral infection, unspecified: Secondary | ICD-10-CM | POA: Diagnosis not present

## 2012-10-15 DIAGNOSIS — L570 Actinic keratosis: Secondary | ICD-10-CM | POA: Diagnosis not present

## 2012-10-15 DIAGNOSIS — L821 Other seborrheic keratosis: Secondary | ICD-10-CM | POA: Diagnosis not present

## 2012-11-02 DIAGNOSIS — M79609 Pain in unspecified limb: Secondary | ICD-10-CM | POA: Diagnosis not present

## 2012-11-02 DIAGNOSIS — M25579 Pain in unspecified ankle and joints of unspecified foot: Secondary | ICD-10-CM | POA: Diagnosis not present

## 2012-11-02 DIAGNOSIS — M659 Synovitis and tenosynovitis, unspecified: Secondary | ICD-10-CM | POA: Diagnosis not present

## 2012-11-02 DIAGNOSIS — M65979 Unspecified synovitis and tenosynovitis, unspecified ankle and foot: Secondary | ICD-10-CM | POA: Diagnosis not present

## 2012-11-03 ENCOUNTER — Ambulatory Visit (INDEPENDENT_AMBULATORY_CARE_PROVIDER_SITE_OTHER): Payer: Medicare Other | Admitting: Gastroenterology

## 2012-11-03 DIAGNOSIS — E538 Deficiency of other specified B group vitamins: Secondary | ICD-10-CM | POA: Diagnosis not present

## 2012-11-04 ENCOUNTER — Telehealth: Payer: Self-pay | Admitting: Gastroenterology

## 2012-11-04 DIAGNOSIS — T7840XA Allergy, unspecified, initial encounter: Secondary | ICD-10-CM | POA: Diagnosis not present

## 2012-11-04 DIAGNOSIS — Z131 Encounter for screening for diabetes mellitus: Secondary | ICD-10-CM | POA: Diagnosis not present

## 2012-11-04 NOTE — Telephone Encounter (Signed)
Pt reports she had a flu shot yesterday and her cheeks and nose developed a red rash. She saw her PCP today who gave her a steroid injection. Her PCP asked her to call us to report the reaction. Dr Jarold Motto, I have checked the [facility] and LOT number on our B12 supply and we have at least 8 boxes of the vials all with the same NDC and LOT numbers. I have listed the injection as an allergy, is it safe to have the pt use Nascobol or PO B12? Thanks.

## 2012-11-04 NOTE — Telephone Encounter (Signed)
yes

## 2012-11-05 NOTE — Telephone Encounter (Signed)
lmom for pt to call back

## 2012-11-09 NOTE — Telephone Encounter (Signed)
lmom for pt to call back

## 2012-11-10 ENCOUNTER — Other Ambulatory Visit: Payer: Self-pay

## 2012-11-10 DIAGNOSIS — M7750 Other enthesopathy of unspecified foot: Secondary | ICD-10-CM | POA: Diagnosis not present

## 2012-11-10 NOTE — Telephone Encounter (Signed)
Pt never called back.

## 2012-11-10 NOTE — Telephone Encounter (Signed)
Pt called back and she wants to know if her injection was the same LOT and Manufacturer as she has been receiving? Will check and call her back.

## 2012-11-13 ENCOUNTER — Other Ambulatory Visit: Payer: Self-pay

## 2012-11-13 ENCOUNTER — Telehealth: Payer: Self-pay | Admitting: *Deleted

## 2012-11-13 NOTE — Telephone Encounter (Signed)
Informed pt we found out the Lot Number and Manufacturing numbers are the same for October and November vials of Vit B12. Pt stated understanding. She found out the Nascobal is $400 so she will attempt the injection again and wait here for 1 hour to make sure she doesn't have a reaction.

## 2012-12-21 DIAGNOSIS — L909 Atrophic disorder of skin, unspecified: Secondary | ICD-10-CM | POA: Diagnosis not present

## 2012-12-21 DIAGNOSIS — E876 Hypokalemia: Secondary | ICD-10-CM | POA: Diagnosis not present

## 2012-12-21 DIAGNOSIS — L919 Hypertrophic disorder of the skin, unspecified: Secondary | ICD-10-CM | POA: Diagnosis not present

## 2013-03-08 DIAGNOSIS — I10 Essential (primary) hypertension: Secondary | ICD-10-CM | POA: Diagnosis not present

## 2013-03-08 DIAGNOSIS — E782 Mixed hyperlipidemia: Secondary | ICD-10-CM | POA: Diagnosis not present

## 2013-03-08 DIAGNOSIS — R609 Edema, unspecified: Secondary | ICD-10-CM | POA: Diagnosis not present

## 2013-03-08 DIAGNOSIS — I4891 Unspecified atrial fibrillation: Secondary | ICD-10-CM | POA: Diagnosis not present

## 2013-03-08 DIAGNOSIS — E876 Hypokalemia: Secondary | ICD-10-CM | POA: Diagnosis not present

## 2013-03-08 DIAGNOSIS — E538 Deficiency of other specified B group vitamins: Secondary | ICD-10-CM | POA: Diagnosis not present

## 2013-04-12 DIAGNOSIS — Z85828 Personal history of other malignant neoplasm of skin: Secondary | ICD-10-CM | POA: Diagnosis not present

## 2013-04-12 DIAGNOSIS — D239 Other benign neoplasm of skin, unspecified: Secondary | ICD-10-CM | POA: Diagnosis not present

## 2013-04-12 DIAGNOSIS — M545 Low back pain: Secondary | ICD-10-CM | POA: Diagnosis not present

## 2013-04-12 DIAGNOSIS — L821 Other seborrheic keratosis: Secondary | ICD-10-CM | POA: Diagnosis not present

## 2013-04-12 DIAGNOSIS — L570 Actinic keratosis: Secondary | ICD-10-CM | POA: Diagnosis not present

## 2013-04-12 DIAGNOSIS — R82998 Other abnormal findings in urine: Secondary | ICD-10-CM | POA: Diagnosis not present

## 2013-04-12 DIAGNOSIS — L259 Unspecified contact dermatitis, unspecified cause: Secondary | ICD-10-CM | POA: Diagnosis not present

## 2013-06-02 ENCOUNTER — Other Ambulatory Visit: Payer: Self-pay | Admitting: Gastroenterology

## 2013-06-10 ENCOUNTER — Telehealth: Payer: Self-pay | Admitting: Gastroenterology

## 2013-06-10 ENCOUNTER — Other Ambulatory Visit: Payer: Self-pay | Admitting: *Deleted

## 2013-06-10 MED ORDER — DIFENOXIN-ATROPINE 1-0.025 MG PO TABS: ORAL_TABLET | ORAL | Status: DC

## 2013-06-10 MED ORDER — DIPHENOXYLATE-ATROPINE 2.5-0.025 MG PO TABS: 2.0000 | ORAL_TABLET | Freq: Four times a day (QID) | ORAL | Status: DC | PRN

## 2013-06-10 NOTE — Telephone Encounter (Signed)
Needs a new medication for loose stools

## 2013-06-10 NOTE — Telephone Encounter (Signed)
Pt reports she's going to [place] and wants to take an anti diarrheal just in case. Informed her they do not make Motofen anymore and can she take Lomotil? Pt states Dr Jarold Motto recommended that drug, she will try Lomotil if it's OK to take with AFIB. Called Tammy pharmacist at [facility] and it's safe; informed pt I ordered Lomotil.

## 2013-06-10 NOTE — Telephone Encounter (Signed)
Please refill for 30 pills, no refills. Thanks.

## 2013-06-10 NOTE — Telephone Encounter (Signed)
Not sure what this medication is and last sent was 01-2012 So Im not sure what we can send in place of this medication or can we send this medication

## 2013-06-10 NOTE — Telephone Encounter (Signed)
lmom for pt to call back

## 2013-06-10 NOTE — Telephone Encounter (Signed)
rx faxed

## 2013-06-10 NOTE — Telephone Encounter (Signed)
Sent RX as old one was sent

## 2013-06-10 NOTE — Addendum Note (Signed)
Addended by: Florene Glen on: 06/10/2013 03:21 PM   Modules accepted: Orders

## 2013-08-06 DIAGNOSIS — H109 Unspecified conjunctivitis: Secondary | ICD-10-CM | POA: Diagnosis not present

## 2013-09-04 DIAGNOSIS — R109 Unspecified abdominal pain: Secondary | ICD-10-CM | POA: Diagnosis not present

## 2013-09-04 DIAGNOSIS — M549 Dorsalgia, unspecified: Secondary | ICD-10-CM | POA: Diagnosis not present

## 2014-02-23 ENCOUNTER — Other Ambulatory Visit: Payer: Self-pay | Admitting: Gastroenterology

## 2014-03-07 DIAGNOSIS — I10 Essential (primary) hypertension: Secondary | ICD-10-CM | POA: Diagnosis not present

## 2014-03-07 DIAGNOSIS — R197 Diarrhea, unspecified: Secondary | ICD-10-CM | POA: Diagnosis not present

## 2014-03-07 DIAGNOSIS — E538 Deficiency of other specified B group vitamins: Secondary | ICD-10-CM | POA: Diagnosis not present

## 2014-03-07 DIAGNOSIS — I4891 Unspecified atrial fibrillation: Secondary | ICD-10-CM | POA: Diagnosis not present

## 2014-03-10 DIAGNOSIS — E538 Deficiency of other specified B group vitamins: Secondary | ICD-10-CM | POA: Diagnosis not present

## 2014-04-04 DIAGNOSIS — E538 Deficiency of other specified B group vitamins: Secondary | ICD-10-CM | POA: Diagnosis not present

## 2014-04-25 DIAGNOSIS — Z85828 Personal history of other malignant neoplasm of skin: Secondary | ICD-10-CM | POA: Diagnosis not present

## 2014-04-25 DIAGNOSIS — L821 Other seborrheic keratosis: Secondary | ICD-10-CM | POA: Diagnosis not present

## 2014-04-25 DIAGNOSIS — L408 Other psoriasis: Secondary | ICD-10-CM | POA: Diagnosis not present

## 2014-04-25 DIAGNOSIS — L719 Rosacea, unspecified: Secondary | ICD-10-CM | POA: Diagnosis not present

## 2014-04-25 DIAGNOSIS — D239 Other benign neoplasm of skin, unspecified: Secondary | ICD-10-CM | POA: Diagnosis not present

## 2014-04-30 DIAGNOSIS — M549 Dorsalgia, unspecified: Secondary | ICD-10-CM | POA: Diagnosis not present

## 2014-05-13 DIAGNOSIS — E538 Deficiency of other specified B group vitamins: Secondary | ICD-10-CM | POA: Diagnosis not present

## 2014-06-16 DIAGNOSIS — E538 Deficiency of other specified B group vitamins: Secondary | ICD-10-CM | POA: Diagnosis not present

## 2014-08-12 DIAGNOSIS — E538 Deficiency of other specified B group vitamins: Secondary | ICD-10-CM | POA: Diagnosis not present

## 2014-10-03 DIAGNOSIS — D510 Vitamin B12 deficiency anemia due to intrinsic factor deficiency: Secondary | ICD-10-CM | POA: Diagnosis not present

## 2014-11-01 DIAGNOSIS — Z23 Encounter for immunization: Secondary | ICD-10-CM | POA: Diagnosis not present

## 2014-11-21 DIAGNOSIS — E538 Deficiency of other specified B group vitamins: Secondary | ICD-10-CM | POA: Diagnosis not present

## 2014-11-28 DIAGNOSIS — K5900 Constipation, unspecified: Secondary | ICD-10-CM | POA: Diagnosis not present

## 2014-12-26 DIAGNOSIS — E538 Deficiency of other specified B group vitamins: Secondary | ICD-10-CM | POA: Diagnosis not present

## 2015-01-25 DIAGNOSIS — E538 Deficiency of other specified B group vitamins: Secondary | ICD-10-CM | POA: Diagnosis not present

## 2015-02-01 DIAGNOSIS — E538 Deficiency of other specified B group vitamins: Secondary | ICD-10-CM | POA: Diagnosis not present

## 2015-02-01 DIAGNOSIS — M720 Palmar fascial fibromatosis [Dupuytren]: Secondary | ICD-10-CM | POA: Diagnosis not present

## 2015-02-01 DIAGNOSIS — E782 Mixed hyperlipidemia: Secondary | ICD-10-CM | POA: Diagnosis not present

## 2015-02-01 DIAGNOSIS — I10 Essential (primary) hypertension: Secondary | ICD-10-CM | POA: Diagnosis not present

## 2015-02-03 DIAGNOSIS — H35371 Puckering of macula, right eye: Secondary | ICD-10-CM | POA: Diagnosis not present

## 2015-02-03 DIAGNOSIS — H2513 Age-related nuclear cataract, bilateral: Secondary | ICD-10-CM | POA: Diagnosis not present

## 2015-02-05 DIAGNOSIS — B009 Herpesviral infection, unspecified: Secondary | ICD-10-CM | POA: Diagnosis not present

## 2015-02-13 DIAGNOSIS — H35373 Puckering of macula, bilateral: Secondary | ICD-10-CM | POA: Diagnosis not present

## 2015-02-13 DIAGNOSIS — H2513 Age-related nuclear cataract, bilateral: Secondary | ICD-10-CM | POA: Diagnosis not present

## 2015-03-01 DIAGNOSIS — E538 Deficiency of other specified B group vitamins: Secondary | ICD-10-CM | POA: Diagnosis not present

## 2015-03-01 DIAGNOSIS — J069 Acute upper respiratory infection, unspecified: Secondary | ICD-10-CM | POA: Diagnosis not present

## 2015-03-01 DIAGNOSIS — J209 Acute bronchitis, unspecified: Secondary | ICD-10-CM | POA: Diagnosis not present

## 2015-03-16 DIAGNOSIS — J309 Allergic rhinitis, unspecified: Secondary | ICD-10-CM | POA: Diagnosis not present

## 2015-04-07 DIAGNOSIS — E538 Deficiency of other specified B group vitamins: Secondary | ICD-10-CM | POA: Diagnosis not present

## 2015-04-17 DIAGNOSIS — M720 Palmar fascial fibromatosis [Dupuytren]: Secondary | ICD-10-CM | POA: Diagnosis not present

## 2015-05-04 DIAGNOSIS — L719 Rosacea, unspecified: Secondary | ICD-10-CM | POA: Diagnosis not present

## 2015-05-04 DIAGNOSIS — L308 Other specified dermatitis: Secondary | ICD-10-CM | POA: Diagnosis not present

## 2015-05-04 DIAGNOSIS — D225 Melanocytic nevi of trunk: Secondary | ICD-10-CM | POA: Diagnosis not present

## 2015-05-04 DIAGNOSIS — L821 Other seborrheic keratosis: Secondary | ICD-10-CM | POA: Diagnosis not present

## 2015-05-04 DIAGNOSIS — Z85828 Personal history of other malignant neoplasm of skin: Secondary | ICD-10-CM | POA: Diagnosis not present

## 2015-05-04 DIAGNOSIS — D485 Neoplasm of uncertain behavior of skin: Secondary | ICD-10-CM | POA: Diagnosis not present

## 2015-05-10 DIAGNOSIS — L299 Pruritus, unspecified: Secondary | ICD-10-CM | POA: Diagnosis not present

## 2015-05-10 DIAGNOSIS — L309 Dermatitis, unspecified: Secondary | ICD-10-CM | POA: Diagnosis not present

## 2015-05-10 DIAGNOSIS — Z4889 Encounter for other specified surgical aftercare: Secondary | ICD-10-CM | POA: Diagnosis not present

## 2015-05-23 DIAGNOSIS — E538 Deficiency of other specified B group vitamins: Secondary | ICD-10-CM | POA: Diagnosis not present

## 2015-06-15 DIAGNOSIS — M25571 Pain in right ankle and joints of right foot: Secondary | ICD-10-CM | POA: Diagnosis not present

## 2015-06-15 DIAGNOSIS — M722 Plantar fascial fibromatosis: Secondary | ICD-10-CM | POA: Diagnosis not present

## 2015-06-15 DIAGNOSIS — M65871 Other synovitis and tenosynovitis, right ankle and foot: Secondary | ICD-10-CM | POA: Diagnosis not present

## 2015-06-27 DIAGNOSIS — H2512 Age-related nuclear cataract, left eye: Secondary | ICD-10-CM | POA: Diagnosis not present

## 2015-06-27 DIAGNOSIS — H18411 Arcus senilis, right eye: Secondary | ICD-10-CM | POA: Diagnosis not present

## 2015-06-27 DIAGNOSIS — H02839 Dermatochalasis of unspecified eye, unspecified eyelid: Secondary | ICD-10-CM | POA: Diagnosis not present

## 2015-06-27 DIAGNOSIS — H2511 Age-related nuclear cataract, right eye: Secondary | ICD-10-CM | POA: Diagnosis not present

## 2015-06-27 DIAGNOSIS — H35371 Puckering of macula, right eye: Secondary | ICD-10-CM | POA: Diagnosis not present

## 2015-06-27 DIAGNOSIS — H35372 Puckering of macula, left eye: Secondary | ICD-10-CM | POA: Diagnosis not present

## 2015-07-03 DIAGNOSIS — E538 Deficiency of other specified B group vitamins: Secondary | ICD-10-CM | POA: Diagnosis not present

## 2015-08-02 DIAGNOSIS — H2513 Age-related nuclear cataract, bilateral: Secondary | ICD-10-CM | POA: Diagnosis not present

## 2015-08-02 DIAGNOSIS — H35373 Puckering of macula, bilateral: Secondary | ICD-10-CM | POA: Diagnosis not present

## 2015-08-16 DIAGNOSIS — E538 Deficiency of other specified B group vitamins: Secondary | ICD-10-CM | POA: Diagnosis not present

## 2015-08-31 DIAGNOSIS — H2511 Age-related nuclear cataract, right eye: Secondary | ICD-10-CM | POA: Diagnosis not present

## 2015-09-20 DIAGNOSIS — E538 Deficiency of other specified B group vitamins: Secondary | ICD-10-CM | POA: Diagnosis not present

## 2015-10-06 DIAGNOSIS — E876 Hypokalemia: Secondary | ICD-10-CM | POA: Diagnosis not present

## 2015-10-12 DIAGNOSIS — Z880 Allergy status to penicillin: Secondary | ICD-10-CM | POA: Diagnosis not present

## 2015-10-12 DIAGNOSIS — S80212A Abrasion, left knee, initial encounter: Secondary | ICD-10-CM | POA: Diagnosis not present

## 2015-10-12 DIAGNOSIS — Z79899 Other long term (current) drug therapy: Secondary | ICD-10-CM | POA: Diagnosis not present

## 2015-10-12 DIAGNOSIS — S0512XA Contusion of eyeball and orbital tissues, left eye, initial encounter: Secondary | ICD-10-CM | POA: Diagnosis not present

## 2015-10-12 DIAGNOSIS — T148 Other injury of unspecified body region: Secondary | ICD-10-CM | POA: Diagnosis not present

## 2015-10-12 DIAGNOSIS — S065X0A Traumatic subdural hemorrhage without loss of consciousness, initial encounter: Secondary | ICD-10-CM | POA: Diagnosis not present

## 2015-10-12 DIAGNOSIS — S7012XA Contusion of left thigh, initial encounter: Secondary | ICD-10-CM | POA: Diagnosis not present

## 2015-10-12 DIAGNOSIS — F09 Unspecified mental disorder due to known physiological condition: Secondary | ICD-10-CM | POA: Diagnosis not present

## 2015-10-12 DIAGNOSIS — S0083XA Contusion of other part of head, initial encounter: Secondary | ICD-10-CM | POA: Diagnosis not present

## 2015-10-12 DIAGNOSIS — S80211A Abrasion, right knee, initial encounter: Secondary | ICD-10-CM | POA: Diagnosis not present

## 2015-10-12 DIAGNOSIS — S01511A Laceration without foreign body of lip, initial encounter: Secondary | ICD-10-CM | POA: Diagnosis not present

## 2015-10-12 DIAGNOSIS — S0511XA Contusion of eyeball and orbital tissues, right eye, initial encounter: Secondary | ICD-10-CM | POA: Diagnosis not present

## 2015-10-12 DIAGNOSIS — Z7982 Long term (current) use of aspirin: Secondary | ICD-10-CM | POA: Diagnosis not present

## 2015-10-12 DIAGNOSIS — I4891 Unspecified atrial fibrillation: Secondary | ICD-10-CM | POA: Diagnosis not present

## 2015-10-12 DIAGNOSIS — S066X9A Traumatic subarachnoid hemorrhage with loss of consciousness of unspecified duration, initial encounter: Secondary | ICD-10-CM | POA: Diagnosis not present

## 2015-10-12 DIAGNOSIS — M5030 Other cervical disc degeneration, unspecified cervical region: Secondary | ICD-10-CM | POA: Diagnosis not present

## 2015-10-12 DIAGNOSIS — T07 Unspecified multiple injuries: Secondary | ICD-10-CM | POA: Diagnosis not present

## 2015-10-12 DIAGNOSIS — Z9049 Acquired absence of other specified parts of digestive tract: Secondary | ICD-10-CM | POA: Diagnosis not present

## 2015-10-12 DIAGNOSIS — Z87891 Personal history of nicotine dependence: Secondary | ICD-10-CM | POA: Diagnosis not present

## 2015-10-12 DIAGNOSIS — S8001XA Contusion of right knee, initial encounter: Secondary | ICD-10-CM | POA: Diagnosis not present

## 2015-10-12 DIAGNOSIS — S8002XA Contusion of left knee, initial encounter: Secondary | ICD-10-CM | POA: Diagnosis not present

## 2015-10-12 DIAGNOSIS — M7989 Other specified soft tissue disorders: Secondary | ICD-10-CM | POA: Diagnosis not present

## 2015-10-12 DIAGNOSIS — S0181XA Laceration without foreign body of other part of head, initial encounter: Secondary | ICD-10-CM | POA: Diagnosis not present

## 2015-10-12 DIAGNOSIS — M79652 Pain in left thigh: Secondary | ICD-10-CM | POA: Diagnosis not present

## 2015-10-12 DIAGNOSIS — S59919A Unspecified injury of unspecified forearm, initial encounter: Secondary | ICD-10-CM | POA: Diagnosis not present

## 2015-10-12 DIAGNOSIS — J339 Nasal polyp, unspecified: Secondary | ICD-10-CM | POA: Diagnosis not present

## 2015-10-12 DIAGNOSIS — S06899A Other specified intracranial injury with loss of consciousness of unspecified duration, initial encounter: Secondary | ICD-10-CM | POA: Diagnosis not present

## 2015-10-12 DIAGNOSIS — R0781 Pleurodynia: Secondary | ICD-10-CM | POA: Diagnosis not present

## 2015-10-12 DIAGNOSIS — R930 Abnormal findings on diagnostic imaging of skull and head, not elsewhere classified: Secondary | ICD-10-CM | POA: Diagnosis not present

## 2015-10-12 DIAGNOSIS — Z885 Allergy status to narcotic agent status: Secondary | ICD-10-CM | POA: Diagnosis not present

## 2015-10-13 DIAGNOSIS — M25522 Pain in left elbow: Secondary | ICD-10-CM | POA: Diagnosis not present

## 2015-10-13 DIAGNOSIS — M7989 Other specified soft tissue disorders: Secondary | ICD-10-CM | POA: Diagnosis not present

## 2015-10-13 DIAGNOSIS — I619 Nontraumatic intracerebral hemorrhage, unspecified: Secondary | ICD-10-CM | POA: Diagnosis not present

## 2015-10-13 DIAGNOSIS — I629 Nontraumatic intracranial hemorrhage, unspecified: Secondary | ICD-10-CM | POA: Diagnosis not present

## 2015-10-13 DIAGNOSIS — I4891 Unspecified atrial fibrillation: Secondary | ICD-10-CM | POA: Diagnosis not present

## 2015-10-13 DIAGNOSIS — Z6827 Body mass index (BMI) 27.0-27.9, adult: Secondary | ICD-10-CM | POA: Diagnosis not present

## 2015-10-13 DIAGNOSIS — S066X0A Traumatic subarachnoid hemorrhage without loss of consciousness, initial encounter: Secondary | ICD-10-CM | POA: Diagnosis not present

## 2015-10-13 DIAGNOSIS — M79605 Pain in left leg: Secondary | ICD-10-CM | POA: Diagnosis not present

## 2015-10-13 DIAGNOSIS — S065X0A Traumatic subdural hemorrhage without loss of consciousness, initial encounter: Secondary | ICD-10-CM | POA: Diagnosis not present

## 2015-10-13 DIAGNOSIS — S06360A Traumatic hemorrhage of cerebrum, unspecified, without loss of consciousness, initial encounter: Secondary | ICD-10-CM | POA: Diagnosis not present

## 2015-10-16 DIAGNOSIS — T148 Other injury of unspecified body region: Secondary | ICD-10-CM | POA: Diagnosis not present

## 2015-10-16 DIAGNOSIS — I10 Essential (primary) hypertension: Secondary | ICD-10-CM | POA: Diagnosis not present

## 2015-10-16 DIAGNOSIS — S60511A Abrasion of right hand, initial encounter: Secondary | ICD-10-CM | POA: Diagnosis not present

## 2015-10-16 DIAGNOSIS — I480 Paroxysmal atrial fibrillation: Secondary | ICD-10-CM | POA: Diagnosis not present

## 2015-10-18 ENCOUNTER — Encounter (HOSPITAL_COMMUNITY): Payer: Self-pay

## 2015-10-18 ENCOUNTER — Emergency Department (HOSPITAL_COMMUNITY)
Admission: EM | Admit: 2015-10-18 | Discharge: 2015-10-18 | Disposition: A | Discharge status: Home / Self Care | Payer: Medicare Other | Attending: Emergency Medicine | Admitting: Emergency Medicine

## 2015-10-18 ENCOUNTER — Emergency Department (HOSPITAL_COMMUNITY): Payer: Medicare Other

## 2015-10-18 ENCOUNTER — Emergency Department (EMERGENCY_DEPARTMENT_HOSPITAL): Payer: Medicare Other

## 2015-10-18 DIAGNOSIS — F419 Anxiety disorder, unspecified: Secondary | ICD-10-CM | POA: Insufficient documentation

## 2015-10-18 DIAGNOSIS — R220 Localized swelling, mass and lump, head: Secondary | ICD-10-CM | POA: Diagnosis not present

## 2015-10-18 DIAGNOSIS — M7989 Other specified soft tissue disorders: Secondary | ICD-10-CM

## 2015-10-18 DIAGNOSIS — Z79899 Other long term (current) drug therapy: Secondary | ICD-10-CM | POA: Insufficient documentation

## 2015-10-18 DIAGNOSIS — I480 Paroxysmal atrial fibrillation: Secondary | ICD-10-CM | POA: Insufficient documentation

## 2015-10-18 DIAGNOSIS — Z87891 Personal history of nicotine dependence: Secondary | ICD-10-CM | POA: Diagnosis not present

## 2015-10-18 DIAGNOSIS — M1990 Unspecified osteoarthritis, unspecified site: Secondary | ICD-10-CM | POA: Diagnosis not present

## 2015-10-18 DIAGNOSIS — Z880 Allergy status to penicillin: Secondary | ICD-10-CM | POA: Insufficient documentation

## 2015-10-18 DIAGNOSIS — I10 Essential (primary) hypertension: Secondary | ICD-10-CM | POA: Diagnosis not present

## 2015-10-18 DIAGNOSIS — Z7982 Long term (current) use of aspirin: Secondary | ICD-10-CM | POA: Insufficient documentation

## 2015-10-18 DIAGNOSIS — I493 Ventricular premature depolarization: Secondary | ICD-10-CM | POA: Insufficient documentation

## 2015-10-18 DIAGNOSIS — I491 Atrial premature depolarization: Secondary | ICD-10-CM | POA: Insufficient documentation

## 2015-10-18 DIAGNOSIS — M25472 Effusion, left ankle: Secondary | ICD-10-CM | POA: Diagnosis not present

## 2015-10-18 DIAGNOSIS — M25572 Pain in left ankle and joints of left foot: Secondary | ICD-10-CM | POA: Diagnosis not present

## 2015-10-18 DIAGNOSIS — Z8719 Personal history of other diseases of the digestive system: Secondary | ICD-10-CM | POA: Insufficient documentation

## 2015-10-18 DIAGNOSIS — Z792 Long term (current) use of antibiotics: Secondary | ICD-10-CM | POA: Diagnosis not present

## 2015-10-18 LAB — CBC
HEMATOCRIT: 26.5 % — AB (ref 36.0–46.0)
Hemoglobin: 9.1 g/dL — ABNORMAL LOW (ref 12.0–15.0)
MCH: 34 pg (ref 26.0–34.0)
MCHC: 34.3 g/dL (ref 30.0–36.0)
MCV: 98.9 fL (ref 78.0–100.0)
PLATELETS: 212 10*3/uL (ref 150–400)
RBC: 2.68 MIL/uL — AB (ref 3.87–5.11)
RDW: 12.5 % (ref 11.5–15.5)
WBC: 6 10*3/uL (ref 4.0–10.5)

## 2015-10-18 LAB — BASIC METABOLIC PANEL
Anion gap: 8 (ref 5–15)
BUN: 11 mg/dL (ref 6–20)
CHLORIDE: 102 mmol/L (ref 101–111)
CO2: 30 mmol/L (ref 22–32)
Calcium: 8.8 mg/dL — ABNORMAL LOW (ref 8.9–10.3)
Creatinine, Ser: 0.54 mg/dL (ref 0.44–1.00)
Glucose, Bld: 119 mg/dL — ABNORMAL HIGH (ref 65–99)
POTASSIUM: 3 mmol/L — AB (ref 3.5–5.1)
SODIUM: 140 mmol/L (ref 135–145)

## 2015-10-18 MED ORDER — CEPHALEXIN 500 MG PO CAPS: 500.0000 mg | ORAL_CAPSULE | Freq: Four times a day (QID) | ORAL | Status: DC

## 2015-10-18 MED ORDER — HYDROCODONE-ACETAMINOPHEN 5-325 MG PO TABS
0.5000 | ORAL_TABLET | Freq: Four times a day (QID) | ORAL | Status: AC | PRN
Start: 2015-10-18 — End: ?

## 2015-10-18 MED ORDER — HYDROCODONE-ACETAMINOPHEN 5-325 MG PO TABS: 0.5000 | ORAL_TABLET | Freq: Four times a day (QID) | ORAL | Status: DC | PRN

## 2015-10-18 MED ORDER — POTASSIUM CHLORIDE CRYS ER 20 MEQ PO TBCR
20.0000 meq | EXTENDED_RELEASE_TABLET | Freq: Once | ORAL | Status: AC
  Administered 2015-10-18: 20 meq via ORAL
  Filled 2015-10-18: qty 1
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
## 2015-10-18 NOTE — ED Notes (Signed)
Patient transported to CT 

## 2015-10-18 NOTE — Progress Notes (Signed)
VASCULAR LAB PRELIMINARY  PRELIMINARY  PRELIMINARY  PRELIMINARY  Left lower extremity venous duplex completed.    Preliminary report:  Left:  No evidence of DVT, superficial thrombosis, or Baker's cyst.  Chandrea Zellman, RVT 10/18/2015, 6:06 PM
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
## 2015-10-18 NOTE — Discharge Instructions (Signed)
[name], Shorts meeting you! I'm prescribing you half of a tablet or 1 tablet of Norco every 6 hours as needed. If you take Norco, do not take your Tramadol. Only take one or the other, not both. I'm also giving you a prescription for keflex. Only fill the prescription if you develop fever, increased redness/pain/swelling on your right great toe. Keep the area clean, dry, and apply the antibiotic ointment. Feel better soon!  S. Wendie Simmer, PA-C   Ankle Pain Ankle pain is a common symptom. The bones, cartilage, tendons, and muscles of the ankle joint perform a lot of work each day. The ankle joint holds your body weight and allows you to move around. Ankle pain can occur on either side or back of 1 or both ankles. Ankle pain may be sharp and burning or dull and aching. There may be tenderness, stiffness, redness, or warmth around the ankle. The pain occurs more often when a person walks or puts pressure on the ankle. CAUSES  There are many reasons ankle pain can develop. It is important to work with your caregiver to identify the cause since many conditions can impact the bones, cartilage, muscles, and tendons. Causes for ankle pain include:  Injury, including a break (fracture), sprain, or strain often due to a fall, sports, or a high-impact activity.  Swelling (inflammation) of a tendon (tendonitis).  Achilles tendon rupture.  Ankle instability after repeated sprains and strains.  Poor foot alignment.  Pressure on a nerve (tarsal tunnel syndrome).  Arthritis in the ankle or the lining of the ankle.  Crystal formation in the ankle (gout or pseudogout). DIAGNOSIS  A diagnosis is based on your medical history, your symptoms, results of your physical exam, and results of diagnostic tests. Diagnostic tests may include X-ray exams or a computerized magnetic scan (magnetic resonance imaging, MRI). TREATMENT  Treatment will depend on the cause of your ankle pain and may  include:  Keeping pressure off the ankle and limiting activities.  Using crutches or other walking support (a cane or brace).  Using rest, ice, compression, and elevation.  Participating in physical therapy or home exercises.  Wearing shoe inserts or special shoes.  Losing weight.  Taking medications to reduce pain or swelling or receiving an injection.  Undergoing surgery. HOME CARE INSTRUCTIONS   Only take over-the-counter or prescription medicines for pain, discomfort, or fever as directed by your caregiver.  Put ice on the injured area.  Put ice in a plastic bag.  Place a towel between your skin and the bag.  Leave the ice on for 15-20 minutes at a time, 03-04 times a day.  Keep your leg raised (elevated) when possible to lessen swelling.  Avoid activities that cause ankle pain.  Follow specific exercises as directed by your caregiver.  Record how often you have ankle pain, the location of the pain, and what it feels like. This information may be helpful to you and your caregiver.  Ask your caregiver about returning to work or sports and whether you should drive.  Follow up with your caregiver for further examination, therapy, or testing as directed. SEEK MEDICAL CARE IF:   Pain or swelling continues or worsens beyond 1 week.  You have an oral temperature above 102 F (38.9 C).  You are feeling unwell or have chills.  You are having an increasingly difficult time with walking.  You have loss of sensation or other new symptoms.  You have questions or concerns. MAKE SURE  YOU:   Understand these instructions.  Will watch your condition.  Will get help right away if you are not doing well or get worse.   This information is not intended to replace advice given to you by your health care provider. Make sure you discuss any questions you have with your health care provider.   Document Released: 05/29/2010 Document Revised: 03/02/2012 Document Reviewed:  07/11/2015 Elsevier Interactive Patient Education [employer].

## 2015-10-19 DIAGNOSIS — E876 Hypokalemia: Secondary | ICD-10-CM | POA: Diagnosis not present

## 2015-10-19 DIAGNOSIS — Z4802 Encounter for removal of sutures: Secondary | ICD-10-CM | POA: Diagnosis not present

## 2015-10-19 DIAGNOSIS — M79606 Pain in leg, unspecified: Secondary | ICD-10-CM | POA: Diagnosis not present

## 2015-10-24 DIAGNOSIS — I10 Essential (primary) hypertension: Secondary | ICD-10-CM | POA: Diagnosis not present

## 2015-10-24 DIAGNOSIS — I480 Paroxysmal atrial fibrillation: Secondary | ICD-10-CM | POA: Diagnosis not present

## 2015-10-24 DIAGNOSIS — T148 Other injury of unspecified body region: Secondary | ICD-10-CM | POA: Diagnosis not present

## 2015-10-24 DIAGNOSIS — D649 Anemia, unspecified: Secondary | ICD-10-CM | POA: Diagnosis not present

## 2015-10-26 DIAGNOSIS — M6281 Muscle weakness (generalized): Secondary | ICD-10-CM | POA: Diagnosis not present

## 2015-10-26 DIAGNOSIS — S7012XD Contusion of left thigh, subsequent encounter: Secondary | ICD-10-CM | POA: Diagnosis not present

## 2015-10-26 DIAGNOSIS — F419 Anxiety disorder, unspecified: Secondary | ICD-10-CM | POA: Diagnosis not present

## 2015-10-26 DIAGNOSIS — I10 Essential (primary) hypertension: Secondary | ICD-10-CM | POA: Diagnosis not present

## 2015-10-26 DIAGNOSIS — I480 Paroxysmal atrial fibrillation: Secondary | ICD-10-CM | POA: Diagnosis not present

## 2015-10-26 DIAGNOSIS — Z87891 Personal history of nicotine dependence: Secondary | ICD-10-CM | POA: Diagnosis not present

## 2015-10-27 DIAGNOSIS — I480 Paroxysmal atrial fibrillation: Secondary | ICD-10-CM | POA: Diagnosis not present

## 2015-10-27 DIAGNOSIS — M6281 Muscle weakness (generalized): Secondary | ICD-10-CM | POA: Diagnosis not present

## 2015-10-27 DIAGNOSIS — Z87891 Personal history of nicotine dependence: Secondary | ICD-10-CM | POA: Diagnosis not present

## 2015-10-27 DIAGNOSIS — I10 Essential (primary) hypertension: Secondary | ICD-10-CM | POA: Diagnosis not present

## 2015-10-27 DIAGNOSIS — F419 Anxiety disorder, unspecified: Secondary | ICD-10-CM | POA: Diagnosis not present

## 2015-10-27 DIAGNOSIS — S7012XD Contusion of left thigh, subsequent encounter: Secondary | ICD-10-CM | POA: Diagnosis not present

## 2015-10-30 DIAGNOSIS — Z87891 Personal history of nicotine dependence: Secondary | ICD-10-CM | POA: Diagnosis not present

## 2015-10-30 DIAGNOSIS — M6281 Muscle weakness (generalized): Secondary | ICD-10-CM | POA: Diagnosis not present

## 2015-10-30 DIAGNOSIS — F419 Anxiety disorder, unspecified: Secondary | ICD-10-CM | POA: Diagnosis not present

## 2015-10-30 DIAGNOSIS — I10 Essential (primary) hypertension: Secondary | ICD-10-CM | POA: Diagnosis not present

## 2015-10-30 DIAGNOSIS — I480 Paroxysmal atrial fibrillation: Secondary | ICD-10-CM | POA: Diagnosis not present

## 2015-10-30 DIAGNOSIS — S7012XD Contusion of left thigh, subsequent encounter: Secondary | ICD-10-CM | POA: Diagnosis not present

## 2015-11-01 DIAGNOSIS — I480 Paroxysmal atrial fibrillation: Secondary | ICD-10-CM | POA: Diagnosis not present

## 2015-11-01 DIAGNOSIS — I10 Essential (primary) hypertension: Secondary | ICD-10-CM | POA: Diagnosis not present

## 2015-11-01 DIAGNOSIS — M6281 Muscle weakness (generalized): Secondary | ICD-10-CM | POA: Diagnosis not present

## 2015-11-01 DIAGNOSIS — F419 Anxiety disorder, unspecified: Secondary | ICD-10-CM | POA: Diagnosis not present

## 2015-11-01 DIAGNOSIS — S7012XD Contusion of left thigh, subsequent encounter: Secondary | ICD-10-CM | POA: Diagnosis not present

## 2015-11-01 DIAGNOSIS — Z87891 Personal history of nicotine dependence: Secondary | ICD-10-CM | POA: Diagnosis not present

## 2015-11-03 DIAGNOSIS — Z87891 Personal history of nicotine dependence: Secondary | ICD-10-CM | POA: Diagnosis not present

## 2015-11-03 DIAGNOSIS — M6281 Muscle weakness (generalized): Secondary | ICD-10-CM | POA: Diagnosis not present

## 2015-11-03 DIAGNOSIS — F419 Anxiety disorder, unspecified: Secondary | ICD-10-CM | POA: Diagnosis not present

## 2015-11-03 DIAGNOSIS — S7012XD Contusion of left thigh, subsequent encounter: Secondary | ICD-10-CM | POA: Diagnosis not present

## 2015-11-03 DIAGNOSIS — I10 Essential (primary) hypertension: Secondary | ICD-10-CM | POA: Diagnosis not present

## 2015-11-03 DIAGNOSIS — I480 Paroxysmal atrial fibrillation: Secondary | ICD-10-CM | POA: Diagnosis not present

## 2015-11-06 ENCOUNTER — Telehealth: Payer: Self-pay | Admitting: Oncology

## 2015-11-06 DIAGNOSIS — S7012XD Contusion of left thigh, subsequent encounter: Secondary | ICD-10-CM | POA: Diagnosis not present

## 2015-11-06 DIAGNOSIS — F419 Anxiety disorder, unspecified: Secondary | ICD-10-CM | POA: Diagnosis not present

## 2015-11-06 DIAGNOSIS — Z87891 Personal history of nicotine dependence: Secondary | ICD-10-CM | POA: Diagnosis not present

## 2015-11-06 DIAGNOSIS — I480 Paroxysmal atrial fibrillation: Secondary | ICD-10-CM | POA: Diagnosis not present

## 2015-11-06 DIAGNOSIS — I10 Essential (primary) hypertension: Secondary | ICD-10-CM | POA: Diagnosis not present

## 2015-11-06 DIAGNOSIS — M6281 Muscle weakness (generalized): Secondary | ICD-10-CM | POA: Diagnosis not present

## 2015-11-06 NOTE — Telephone Encounter (Signed)
LT MESS REGARDING NEW PT REFERRAL

## 2015-11-08 DIAGNOSIS — F419 Anxiety disorder, unspecified: Secondary | ICD-10-CM | POA: Diagnosis not present

## 2015-11-08 DIAGNOSIS — Z87891 Personal history of nicotine dependence: Secondary | ICD-10-CM | POA: Diagnosis not present

## 2015-11-08 DIAGNOSIS — S7012XD Contusion of left thigh, subsequent encounter: Secondary | ICD-10-CM | POA: Diagnosis not present

## 2015-11-08 DIAGNOSIS — M6281 Muscle weakness (generalized): Secondary | ICD-10-CM | POA: Diagnosis not present

## 2015-11-08 DIAGNOSIS — I480 Paroxysmal atrial fibrillation: Secondary | ICD-10-CM | POA: Diagnosis not present

## 2015-11-08 DIAGNOSIS — I10 Essential (primary) hypertension: Secondary | ICD-10-CM | POA: Diagnosis not present

## 2015-11-09 DIAGNOSIS — I480 Paroxysmal atrial fibrillation: Secondary | ICD-10-CM | POA: Diagnosis not present

## 2015-11-09 DIAGNOSIS — M6281 Muscle weakness (generalized): Secondary | ICD-10-CM | POA: Diagnosis not present

## 2015-11-09 DIAGNOSIS — F419 Anxiety disorder, unspecified: Secondary | ICD-10-CM | POA: Diagnosis not present

## 2015-11-09 DIAGNOSIS — Z87891 Personal history of nicotine dependence: Secondary | ICD-10-CM | POA: Diagnosis not present

## 2015-11-09 DIAGNOSIS — I10 Essential (primary) hypertension: Secondary | ICD-10-CM | POA: Diagnosis not present

## 2015-11-09 DIAGNOSIS — S7012XD Contusion of left thigh, subsequent encounter: Secondary | ICD-10-CM | POA: Diagnosis not present

## 2015-11-10 DIAGNOSIS — E538 Deficiency of other specified B group vitamins: Secondary | ICD-10-CM | POA: Diagnosis not present

## 2015-11-11 DIAGNOSIS — M65871 Other synovitis and tenosynovitis, right ankle and foot: Secondary | ICD-10-CM | POA: Diagnosis not present

## 2015-11-13 DIAGNOSIS — S7012XD Contusion of left thigh, subsequent encounter: Secondary | ICD-10-CM | POA: Diagnosis not present

## 2015-11-13 DIAGNOSIS — M6281 Muscle weakness (generalized): Secondary | ICD-10-CM | POA: Diagnosis not present

## 2015-11-13 DIAGNOSIS — I480 Paroxysmal atrial fibrillation: Secondary | ICD-10-CM | POA: Diagnosis not present

## 2015-11-13 DIAGNOSIS — Z87891 Personal history of nicotine dependence: Secondary | ICD-10-CM | POA: Diagnosis not present

## 2015-11-13 DIAGNOSIS — I10 Essential (primary) hypertension: Secondary | ICD-10-CM | POA: Diagnosis not present

## 2015-11-13 DIAGNOSIS — F419 Anxiety disorder, unspecified: Secondary | ICD-10-CM | POA: Diagnosis not present

## 2015-11-15 DIAGNOSIS — S7012XD Contusion of left thigh, subsequent encounter: Secondary | ICD-10-CM | POA: Diagnosis not present

## 2015-11-15 DIAGNOSIS — M6281 Muscle weakness (generalized): Secondary | ICD-10-CM | POA: Diagnosis not present

## 2015-11-15 DIAGNOSIS — M2550 Pain in unspecified joint: Secondary | ICD-10-CM | POA: Diagnosis not present

## 2015-11-15 DIAGNOSIS — F419 Anxiety disorder, unspecified: Secondary | ICD-10-CM | POA: Diagnosis not present

## 2015-11-15 DIAGNOSIS — I10 Essential (primary) hypertension: Secondary | ICD-10-CM | POA: Diagnosis not present

## 2015-11-15 DIAGNOSIS — Z87891 Personal history of nicotine dependence: Secondary | ICD-10-CM | POA: Diagnosis not present

## 2015-11-15 DIAGNOSIS — E782 Mixed hyperlipidemia: Secondary | ICD-10-CM | POA: Diagnosis not present

## 2015-11-15 DIAGNOSIS — T148 Other injury of unspecified body region: Secondary | ICD-10-CM | POA: Diagnosis not present

## 2015-11-15 DIAGNOSIS — M79609 Pain in unspecified limb: Secondary | ICD-10-CM | POA: Diagnosis not present

## 2015-11-15 DIAGNOSIS — I480 Paroxysmal atrial fibrillation: Secondary | ICD-10-CM | POA: Diagnosis not present

## 2015-11-15 DIAGNOSIS — Z79899 Other long term (current) drug therapy: Secondary | ICD-10-CM | POA: Diagnosis not present

## 2015-11-20 DIAGNOSIS — I10 Essential (primary) hypertension: Secondary | ICD-10-CM | POA: Diagnosis not present

## 2015-11-20 DIAGNOSIS — S7012XD Contusion of left thigh, subsequent encounter: Secondary | ICD-10-CM | POA: Diagnosis not present

## 2015-11-20 DIAGNOSIS — M6281 Muscle weakness (generalized): Secondary | ICD-10-CM | POA: Diagnosis not present

## 2015-11-20 DIAGNOSIS — F419 Anxiety disorder, unspecified: Secondary | ICD-10-CM | POA: Diagnosis not present

## 2015-11-20 DIAGNOSIS — I480 Paroxysmal atrial fibrillation: Secondary | ICD-10-CM | POA: Diagnosis not present

## 2015-11-20 DIAGNOSIS — Z87891 Personal history of nicotine dependence: Secondary | ICD-10-CM | POA: Diagnosis not present

## 2015-11-22 DIAGNOSIS — I10 Essential (primary) hypertension: Secondary | ICD-10-CM | POA: Diagnosis not present

## 2015-11-22 DIAGNOSIS — S7012XD Contusion of left thigh, subsequent encounter: Secondary | ICD-10-CM | POA: Diagnosis not present

## 2015-11-22 DIAGNOSIS — Z87891 Personal history of nicotine dependence: Secondary | ICD-10-CM | POA: Diagnosis not present

## 2015-11-22 DIAGNOSIS — M6281 Muscle weakness (generalized): Secondary | ICD-10-CM | POA: Diagnosis not present

## 2015-11-22 DIAGNOSIS — I480 Paroxysmal atrial fibrillation: Secondary | ICD-10-CM | POA: Diagnosis not present

## 2015-11-22 DIAGNOSIS — F419 Anxiety disorder, unspecified: Secondary | ICD-10-CM | POA: Diagnosis not present

## 2015-11-23 DIAGNOSIS — Z87891 Personal history of nicotine dependence: Secondary | ICD-10-CM | POA: Diagnosis not present

## 2015-11-23 DIAGNOSIS — I480 Paroxysmal atrial fibrillation: Secondary | ICD-10-CM | POA: Diagnosis not present

## 2015-11-23 DIAGNOSIS — M6281 Muscle weakness (generalized): Secondary | ICD-10-CM | POA: Diagnosis not present

## 2015-11-23 DIAGNOSIS — S7012XD Contusion of left thigh, subsequent encounter: Secondary | ICD-10-CM | POA: Diagnosis not present

## 2015-11-23 DIAGNOSIS — F419 Anxiety disorder, unspecified: Secondary | ICD-10-CM | POA: Diagnosis not present

## 2015-11-23 DIAGNOSIS — I10 Essential (primary) hypertension: Secondary | ICD-10-CM | POA: Diagnosis not present

## 2015-11-27 DIAGNOSIS — S7012XD Contusion of left thigh, subsequent encounter: Secondary | ICD-10-CM | POA: Diagnosis not present

## 2015-11-27 DIAGNOSIS — Z87891 Personal history of nicotine dependence: Secondary | ICD-10-CM | POA: Diagnosis not present

## 2015-11-27 DIAGNOSIS — M6281 Muscle weakness (generalized): Secondary | ICD-10-CM | POA: Diagnosis not present

## 2015-11-27 DIAGNOSIS — I480 Paroxysmal atrial fibrillation: Secondary | ICD-10-CM | POA: Diagnosis not present

## 2015-11-27 DIAGNOSIS — I10 Essential (primary) hypertension: Secondary | ICD-10-CM | POA: Diagnosis not present

## 2015-11-27 DIAGNOSIS — F419 Anxiety disorder, unspecified: Secondary | ICD-10-CM | POA: Diagnosis not present

## 2015-11-28 DIAGNOSIS — M792 Neuralgia and neuritis, unspecified: Secondary | ICD-10-CM | POA: Diagnosis not present

## 2015-11-28 DIAGNOSIS — M10072 Idiopathic gout, left ankle and foot: Secondary | ICD-10-CM | POA: Diagnosis not present

## 2015-11-28 DIAGNOSIS — M65872 Other synovitis and tenosynovitis, left ankle and foot: Secondary | ICD-10-CM | POA: Diagnosis not present

## 2015-11-28 DIAGNOSIS — M10079 Idiopathic gout, unspecified ankle and foot: Secondary | ICD-10-CM | POA: Diagnosis not present

## 2015-11-28 DIAGNOSIS — M7671 Peroneal tendinitis, right leg: Secondary | ICD-10-CM | POA: Diagnosis not present

## 2015-11-28 DIAGNOSIS — S7002XA Contusion of left hip, initial encounter: Secondary | ICD-10-CM | POA: Diagnosis not present

## 2015-11-28 DIAGNOSIS — M65871 Other synovitis and tenosynovitis, right ankle and foot: Secondary | ICD-10-CM | POA: Diagnosis not present

## 2015-11-28 DIAGNOSIS — M10071 Idiopathic gout, right ankle and foot: Secondary | ICD-10-CM | POA: Diagnosis not present

## 2015-11-29 DIAGNOSIS — Z87891 Personal history of nicotine dependence: Secondary | ICD-10-CM | POA: Diagnosis not present

## 2015-11-29 DIAGNOSIS — S7012XD Contusion of left thigh, subsequent encounter: Secondary | ICD-10-CM | POA: Diagnosis not present

## 2015-11-29 DIAGNOSIS — M6281 Muscle weakness (generalized): Secondary | ICD-10-CM | POA: Diagnosis not present

## 2015-11-29 DIAGNOSIS — I480 Paroxysmal atrial fibrillation: Secondary | ICD-10-CM | POA: Diagnosis not present

## 2015-11-29 DIAGNOSIS — I10 Essential (primary) hypertension: Secondary | ICD-10-CM | POA: Diagnosis not present

## 2015-11-29 DIAGNOSIS — F419 Anxiety disorder, unspecified: Secondary | ICD-10-CM | POA: Diagnosis not present

## 2015-12-01 DIAGNOSIS — M65871 Other synovitis and tenosynovitis, right ankle and foot: Secondary | ICD-10-CM | POA: Diagnosis not present

## 2015-12-01 DIAGNOSIS — M25571 Pain in right ankle and joints of right foot: Secondary | ICD-10-CM | POA: Diagnosis not present

## 2015-12-04 DIAGNOSIS — M6281 Muscle weakness (generalized): Secondary | ICD-10-CM | POA: Diagnosis not present

## 2015-12-04 DIAGNOSIS — S7012XD Contusion of left thigh, subsequent encounter: Secondary | ICD-10-CM | POA: Diagnosis not present

## 2015-12-04 DIAGNOSIS — Z87891 Personal history of nicotine dependence: Secondary | ICD-10-CM | POA: Diagnosis not present

## 2015-12-04 DIAGNOSIS — I480 Paroxysmal atrial fibrillation: Secondary | ICD-10-CM | POA: Diagnosis not present

## 2015-12-04 DIAGNOSIS — F419 Anxiety disorder, unspecified: Secondary | ICD-10-CM | POA: Diagnosis not present

## 2015-12-04 DIAGNOSIS — I10 Essential (primary) hypertension: Secondary | ICD-10-CM | POA: Diagnosis not present

## 2015-12-05 DIAGNOSIS — M25571 Pain in right ankle and joints of right foot: Secondary | ICD-10-CM | POA: Diagnosis not present

## 2015-12-05 DIAGNOSIS — M792 Neuralgia and neuritis, unspecified: Secondary | ICD-10-CM | POA: Diagnosis not present

## 2015-12-06 DIAGNOSIS — S7012XD Contusion of left thigh, subsequent encounter: Secondary | ICD-10-CM | POA: Diagnosis not present

## 2015-12-06 DIAGNOSIS — M6281 Muscle weakness (generalized): Secondary | ICD-10-CM | POA: Diagnosis not present

## 2015-12-06 DIAGNOSIS — I10 Essential (primary) hypertension: Secondary | ICD-10-CM | POA: Diagnosis not present

## 2015-12-06 DIAGNOSIS — I480 Paroxysmal atrial fibrillation: Secondary | ICD-10-CM | POA: Diagnosis not present

## 2015-12-06 DIAGNOSIS — F419 Anxiety disorder, unspecified: Secondary | ICD-10-CM | POA: Diagnosis not present

## 2015-12-06 DIAGNOSIS — Z87891 Personal history of nicotine dependence: Secondary | ICD-10-CM | POA: Diagnosis not present

## 2015-12-11 DIAGNOSIS — M722 Plantar fascial fibromatosis: Secondary | ICD-10-CM | POA: Diagnosis not present

## 2015-12-11 DIAGNOSIS — M25571 Pain in right ankle and joints of right foot: Secondary | ICD-10-CM | POA: Diagnosis not present

## 2015-12-12 DIAGNOSIS — E538 Deficiency of other specified B group vitamins: Secondary | ICD-10-CM | POA: Diagnosis not present

## 2015-12-14 DIAGNOSIS — M71571 Other bursitis, not elsewhere classified, right ankle and foot: Secondary | ICD-10-CM | POA: Diagnosis not present

## 2015-12-14 DIAGNOSIS — M722 Plantar fascial fibromatosis: Secondary | ICD-10-CM | POA: Diagnosis not present

## 2015-12-23 DIAGNOSIS — Z87891 Personal history of nicotine dependence: Secondary | ICD-10-CM | POA: Diagnosis not present

## 2015-12-23 DIAGNOSIS — F419 Anxiety disorder, unspecified: Secondary | ICD-10-CM | POA: Diagnosis not present

## 2015-12-23 DIAGNOSIS — M6281 Muscle weakness (generalized): Secondary | ICD-10-CM | POA: Diagnosis not present

## 2015-12-23 DIAGNOSIS — I480 Paroxysmal atrial fibrillation: Secondary | ICD-10-CM | POA: Diagnosis not present

## 2015-12-23 DIAGNOSIS — I10 Essential (primary) hypertension: Secondary | ICD-10-CM | POA: Diagnosis not present

## 2015-12-23 DIAGNOSIS — S7012XD Contusion of left thigh, subsequent encounter: Secondary | ICD-10-CM | POA: Diagnosis not present

## 2015-12-25 DIAGNOSIS — I480 Paroxysmal atrial fibrillation: Secondary | ICD-10-CM | POA: Diagnosis not present

## 2015-12-25 DIAGNOSIS — I10 Essential (primary) hypertension: Secondary | ICD-10-CM | POA: Diagnosis not present

## 2015-12-25 DIAGNOSIS — S7012XD Contusion of left thigh, subsequent encounter: Secondary | ICD-10-CM | POA: Diagnosis not present

## 2015-12-25 DIAGNOSIS — Z87891 Personal history of nicotine dependence: Secondary | ICD-10-CM | POA: Diagnosis not present

## 2015-12-25 DIAGNOSIS — F419 Anxiety disorder, unspecified: Secondary | ICD-10-CM | POA: Diagnosis not present

## 2015-12-25 DIAGNOSIS — M6281 Muscle weakness (generalized): Secondary | ICD-10-CM | POA: Diagnosis not present

## 2016-01-01 DIAGNOSIS — M79671 Pain in right foot: Secondary | ICD-10-CM | POA: Diagnosis not present

## 2016-01-01 DIAGNOSIS — M79672 Pain in left foot: Secondary | ICD-10-CM | POA: Diagnosis not present

## 2016-01-10 DIAGNOSIS — M25571 Pain in right ankle and joints of right foot: Secondary | ICD-10-CM | POA: Diagnosis not present

## 2016-01-10 DIAGNOSIS — M7751 Other enthesopathy of right foot: Secondary | ICD-10-CM | POA: Diagnosis not present

## 2016-01-12 DIAGNOSIS — M6281 Muscle weakness (generalized): Secondary | ICD-10-CM | POA: Diagnosis not present

## 2016-01-12 DIAGNOSIS — I480 Paroxysmal atrial fibrillation: Secondary | ICD-10-CM | POA: Diagnosis not present

## 2016-01-12 DIAGNOSIS — Z87891 Personal history of nicotine dependence: Secondary | ICD-10-CM | POA: Diagnosis not present

## 2016-01-12 DIAGNOSIS — S7012XD Contusion of left thigh, subsequent encounter: Secondary | ICD-10-CM | POA: Diagnosis not present

## 2016-01-12 DIAGNOSIS — F419 Anxiety disorder, unspecified: Secondary | ICD-10-CM | POA: Diagnosis not present

## 2016-01-12 DIAGNOSIS — I10 Essential (primary) hypertension: Secondary | ICD-10-CM | POA: Diagnosis not present

## 2016-01-18 DIAGNOSIS — E538 Deficiency of other specified B group vitamins: Secondary | ICD-10-CM | POA: Diagnosis not present

## 2016-01-31 ENCOUNTER — Ambulatory Visit (HOSPITAL_BASED_OUTPATIENT_CLINIC_OR_DEPARTMENT_OTHER): Payer: Medicare Other

## 2016-01-31 ENCOUNTER — Ambulatory Visit (HOSPITAL_BASED_OUTPATIENT_CLINIC_OR_DEPARTMENT_OTHER): Payer: Medicare Other | Admitting: Oncology

## 2016-01-31 ENCOUNTER — Encounter: Payer: Self-pay | Admitting: *Deleted

## 2016-01-31 ENCOUNTER — Telehealth: Payer: Self-pay | Admitting: Oncology

## 2016-01-31 VITALS — BP 129/64 | HR 64 | Temp 98.0°F | Resp 18 | Ht 65.0 in | Wt 165.0 lb

## 2016-01-31 DIAGNOSIS — D729 Disorder of white blood cells, unspecified: Secondary | ICD-10-CM | POA: Diagnosis not present

## 2016-01-31 DIAGNOSIS — S7010XD Contusion of unspecified thigh, subsequent encounter: Secondary | ICD-10-CM | POA: Diagnosis not present

## 2016-01-31 DIAGNOSIS — E538 Deficiency of other specified B group vitamins: Secondary | ICD-10-CM | POA: Diagnosis not present

## 2016-01-31 DIAGNOSIS — D509 Iron deficiency anemia, unspecified: Secondary | ICD-10-CM | POA: Diagnosis not present

## 2016-01-31 DIAGNOSIS — D649 Anemia, unspecified: Secondary | ICD-10-CM

## 2016-01-31 LAB — COMPREHENSIVE METABOLIC PANEL
ALBUMIN: 3.7 g/dL (ref 3.5–5.0)
ALK PHOS: 53 U/L (ref 40–150)
ALT: 19 U/L (ref 0–55)
AST: 18 U/L (ref 5–34)
Anion Gap: 11 mEq/L (ref 3–11)
BILIRUBIN TOTAL: 0.92 mg/dL (ref 0.20–1.20)
BUN: 11.9 mg/dL (ref 7.0–26.0)
CO2: 30 mEq/L — ABNORMAL HIGH (ref 22–29)
CREATININE: 0.7 mg/dL (ref 0.6–1.1)
Calcium: 9.4 mg/dL (ref 8.4–10.4)
Chloride: 101 mEq/L (ref 98–109)
EGFR: 86 mL/min/{1.73_m2} — AB (ref >90)
GLUCOSE: 107 mg/dL (ref 70–140)
Potassium: 2.9 mEq/L — CL (ref 3.5–5.1)
SODIUM: 143 meq/L (ref 136–145)
TOTAL PROTEIN: 6.5 g/dL (ref 6.4–8.3)

## 2016-01-31 LAB — CBC WITH DIFFERENTIAL/PLATELET
BASO%: 1 % (ref 0.0–2.0)
Basophils Absolute: 0 10*3/uL (ref 0.0–0.1)
EOS ABS: 0.2 10*3/uL (ref 0.0–0.5)
EOS%: 4.4 % (ref 0.0–7.0)
HCT: 38.2 % (ref 34.8–46.6)
HEMOGLOBIN: 12.7 g/dL (ref 11.6–15.9)
LYMPH%: 30 % (ref 14.0–49.7)
MCH: 29.7 pg (ref 25.1–34.0)
MCHC: 33.2 g/dL (ref 31.5–36.0)
MCV: 89.5 fL (ref 79.5–101.0)
MONO#: 0.3 10*3/uL (ref 0.1–0.9)
MONO%: 8.1 % (ref 0.0–14.0)
NEUT%: 56.5 % (ref 38.4–76.8)
NEUTROS ABS: 2.4 10*3/uL (ref 1.5–6.5)
Platelets: 203 10*3/uL (ref 145–400)
RBC: 4.27 10*6/uL (ref 3.70–5.45)
RDW: 15 % — AB (ref 11.2–14.5)
WBC: 4.2 10*3/uL (ref 3.9–10.3)
lymph#: 1.3 10*3/uL (ref 0.9–3.3)

## 2016-01-31 LAB — IRON AND TIBC
%SAT: 34 % (ref 21–57)
Iron: 111 ug/dL (ref 41–142)
TIBC: 324 ug/dL (ref 236–444)
UIBC: 213 ug/dL (ref 120–384)

## 2016-01-31 LAB — FERRITIN: Ferritin: 92 ng/ml (ref 9–269)
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
## 2016-01-31 NOTE — Telephone Encounter (Signed)
Pt confirmed labs/ov per 02/08 POF, gave pt AVS and Calendar.... KJ °

## 2016-01-31 NOTE — Progress Notes (Signed)
Please see consult note.  

## 2016-01-31 NOTE — Progress Notes (Signed)
This RN faxed lab results to Dr. Lona Kettle at [facility]. Patient aware of lab results.

## 2016-02-01 LAB — VITAMIN B12: VITAMIN B 12: 317 pg/mL (ref 211–946)

## 2016-02-01 LAB — ERYTHROPOIETIN: ERYTHROPOIETIN: 24.2 m[IU]/mL — AB (ref 2.6–18.5)

## 2016-02-02 LAB — MULTIPLE MYELOMA PANEL, SERUM
ALBUMIN/GLOB SERPL: 1.4 (ref 0.7–1.7)
ALPHA2 GLOB SERPL ELPH-MCNC: 0.7 g/dL (ref 0.4–1.0)
Albumin SerPl Elph-Mcnc: 3.2 g/dL (ref 2.9–4.4)
Alpha 1: 0.3 g/dL (ref 0.0–0.4)
B-GLOBULIN SERPL ELPH-MCNC: 1 g/dL (ref 0.7–1.3)
GAMMA GLOB SERPL ELPH-MCNC: 0.4 g/dL (ref 0.4–1.8)
GLOBULIN, TOTAL: 2.4 g/dL (ref 2.2–3.9)
IGG (IMMUNOGLOBIN G), SERUM: 469 mg/dL — AB (ref 700–1600)
IgA, Qn, Serum: 149 mg/dL (ref 64–422)
IgM, Qn, Serum: 37 mg/dL (ref 26–217)
TOTAL PROTEIN: 5.6 g/dL — AB (ref 6.0–8.5)

## 2016-02-05 DIAGNOSIS — H35373 Puckering of macula, bilateral: Secondary | ICD-10-CM | POA: Diagnosis not present

## 2016-02-12 DIAGNOSIS — T148 Other injury of unspecified body region: Secondary | ICD-10-CM | POA: Diagnosis not present

## 2016-02-12 DIAGNOSIS — I10 Essential (primary) hypertension: Secondary | ICD-10-CM | POA: Diagnosis not present

## 2016-02-26 DIAGNOSIS — E538 Deficiency of other specified B group vitamins: Secondary | ICD-10-CM | POA: Diagnosis not present

## 2016-02-26 DIAGNOSIS — S90424A Blister (nonthermal), right lesser toe(s), initial encounter: Secondary | ICD-10-CM | POA: Diagnosis not present

## 2016-03-04 ENCOUNTER — Encounter: Payer: Self-pay | Admitting: *Deleted

## 2016-03-12 ENCOUNTER — Ambulatory Visit (HOSPITAL_BASED_OUTPATIENT_CLINIC_OR_DEPARTMENT_OTHER): Payer: Medicare Other | Admitting: Oncology

## 2016-03-12 VITALS — BP 138/65 | HR 67 | Temp 98.5°F | Resp 17 | Ht 65.0 in | Wt 166.6 lb

## 2016-03-12 DIAGNOSIS — S7012XD Contusion of left thigh, subsequent encounter: Secondary | ICD-10-CM

## 2016-03-12 DIAGNOSIS — D500 Iron deficiency anemia secondary to blood loss (chronic): Secondary | ICD-10-CM | POA: Diagnosis not present

## 2016-03-12 DIAGNOSIS — D509 Iron deficiency anemia, unspecified: Secondary | ICD-10-CM
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
## 2016-03-22 DIAGNOSIS — E538 Deficiency of other specified B group vitamins: Secondary | ICD-10-CM | POA: Diagnosis not present

## 2016-03-22 DIAGNOSIS — E876 Hypokalemia: Secondary | ICD-10-CM | POA: Diagnosis not present

## 2016-03-22 DIAGNOSIS — S90424A Blister (nonthermal), right lesser toe(s), initial encounter: Secondary | ICD-10-CM | POA: Diagnosis not present

## 2016-04-22 ENCOUNTER — Encounter: Payer: Self-pay | Admitting: Internal Medicine

## 2016-04-26 DIAGNOSIS — E538 Deficiency of other specified B group vitamins: Secondary | ICD-10-CM | POA: Diagnosis not present

## 2016-04-30 DIAGNOSIS — W57XXXA Bitten or stung by nonvenomous insect and other nonvenomous arthropods, initial encounter: Secondary | ICD-10-CM | POA: Diagnosis not present

## 2016-04-30 DIAGNOSIS — T148 Other injury of unspecified body region: Secondary | ICD-10-CM | POA: Diagnosis not present

## 2016-04-30 DIAGNOSIS — E876 Hypokalemia: Secondary | ICD-10-CM | POA: Diagnosis not present

## 2016-05-08 DIAGNOSIS — L309 Dermatitis, unspecified: Secondary | ICD-10-CM | POA: Diagnosis not present

## 2016-05-08 DIAGNOSIS — L719 Rosacea, unspecified: Secondary | ICD-10-CM | POA: Diagnosis not present

## 2016-05-08 DIAGNOSIS — L570 Actinic keratosis: Secondary | ICD-10-CM | POA: Diagnosis not present

## 2016-05-08 DIAGNOSIS — D225 Melanocytic nevi of trunk: Secondary | ICD-10-CM | POA: Diagnosis not present

## 2016-05-08 DIAGNOSIS — Z85828 Personal history of other malignant neoplasm of skin: Secondary | ICD-10-CM | POA: Diagnosis not present

## 2016-05-08 DIAGNOSIS — L821 Other seborrheic keratosis: Secondary | ICD-10-CM | POA: Diagnosis not present

## 2016-05-08 DIAGNOSIS — L299 Pruritus, unspecified: Secondary | ICD-10-CM | POA: Diagnosis not present

## 2016-05-30 DIAGNOSIS — E538 Deficiency of other specified B group vitamins: Secondary | ICD-10-CM | POA: Diagnosis not present

## 2016-06-28 DIAGNOSIS — E538 Deficiency of other specified B group vitamins: Secondary | ICD-10-CM | POA: Diagnosis not present

## 2016-08-02 DIAGNOSIS — E538 Deficiency of other specified B group vitamins: Secondary | ICD-10-CM | POA: Diagnosis not present

## 2016-08-14 DIAGNOSIS — I480 Paroxysmal atrial fibrillation: Secondary | ICD-10-CM | POA: Diagnosis not present

## 2016-08-14 DIAGNOSIS — E876 Hypokalemia: Secondary | ICD-10-CM | POA: Diagnosis not present

## 2016-08-14 DIAGNOSIS — E538 Deficiency of other specified B group vitamins: Secondary | ICD-10-CM | POA: Diagnosis not present

## 2016-08-14 DIAGNOSIS — D509 Iron deficiency anemia, unspecified: Secondary | ICD-10-CM | POA: Diagnosis not present

## 2016-08-14 DIAGNOSIS — T148 Other injury of unspecified body region: Secondary | ICD-10-CM | POA: Diagnosis not present

## 2016-09-27 DIAGNOSIS — E538 Deficiency of other specified B group vitamins: Secondary | ICD-10-CM | POA: Diagnosis not present

## 2016-10-15 DIAGNOSIS — Z23 Encounter for immunization: Secondary | ICD-10-CM | POA: Diagnosis not present

## 2016-11-13 DIAGNOSIS — E538 Deficiency of other specified B group vitamins: Secondary | ICD-10-CM | POA: Diagnosis not present

## 2016-11-13 DIAGNOSIS — J029 Acute pharyngitis, unspecified: Secondary | ICD-10-CM | POA: Diagnosis not present

## 2016-12-17 DIAGNOSIS — E538 Deficiency of other specified B group vitamins: Secondary | ICD-10-CM | POA: Diagnosis not present
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
## 2017-01-22 DIAGNOSIS — E538 Deficiency of other specified B group vitamins: Secondary | ICD-10-CM | POA: Diagnosis not present

## 2017-02-24 DIAGNOSIS — H16223 Keratoconjunctivitis sicca, not specified as Sjogren's, bilateral: Secondary | ICD-10-CM | POA: Diagnosis not present

## 2017-03-03 ENCOUNTER — Telehealth: Payer: Self-pay

## 2017-03-03 DIAGNOSIS — Z0000 Encounter for general adult medical examination without abnormal findings: Secondary | ICD-10-CM | POA: Diagnosis not present

## 2017-03-03 DIAGNOSIS — E876 Hypokalemia: Secondary | ICD-10-CM | POA: Diagnosis not present

## 2017-03-03 DIAGNOSIS — I480 Paroxysmal atrial fibrillation: Secondary | ICD-10-CM | POA: Diagnosis not present

## 2017-03-03 DIAGNOSIS — M109 Gout, unspecified: Secondary | ICD-10-CM | POA: Diagnosis not present

## 2017-03-03 DIAGNOSIS — D509 Iron deficiency anemia, unspecified: Secondary | ICD-10-CM | POA: Diagnosis not present

## 2017-03-03 DIAGNOSIS — E782 Mixed hyperlipidemia: Secondary | ICD-10-CM | POA: Diagnosis not present

## 2017-03-03 DIAGNOSIS — I10 Essential (primary) hypertension: Secondary | ICD-10-CM | POA: Diagnosis not present

## 2017-03-03 DIAGNOSIS — E538 Deficiency of other specified B group vitamins: Secondary | ICD-10-CM | POA: Diagnosis not present

## 2017-03-03 NOTE — Telephone Encounter (Signed)
Sent notes to scheduling 

## 2017-03-24 DIAGNOSIS — E538 Deficiency of other specified B group vitamins: Secondary | ICD-10-CM | POA: Diagnosis not present

## 2017-04-16 DIAGNOSIS — G2581 Restless legs syndrome: Secondary | ICD-10-CM | POA: Diagnosis not present

## 2017-04-16 DIAGNOSIS — M62838 Other muscle spasm: Secondary | ICD-10-CM | POA: Diagnosis not present

## 2017-04-16 DIAGNOSIS — E876 Hypokalemia: Secondary | ICD-10-CM | POA: Diagnosis not present

## 2017-04-16 DIAGNOSIS — M79673 Pain in unspecified foot: Secondary | ICD-10-CM | POA: Diagnosis not present

## 2017-04-21 DIAGNOSIS — E538 Deficiency of other specified B group vitamins: Secondary | ICD-10-CM | POA: Diagnosis not present

## 2017-05-23 DIAGNOSIS — E538 Deficiency of other specified B group vitamins: Secondary | ICD-10-CM | POA: Diagnosis not present

## 2017-06-25 DIAGNOSIS — M7662 Achilles tendinitis, left leg: Secondary | ICD-10-CM | POA: Diagnosis not present

## 2017-07-07 DIAGNOSIS — E538 Deficiency of other specified B group vitamins: Secondary | ICD-10-CM | POA: Diagnosis not present

## 2017-08-04 DIAGNOSIS — E538 Deficiency of other specified B group vitamins: Secondary | ICD-10-CM | POA: Diagnosis not present

## 2017-09-10 DIAGNOSIS — E538 Deficiency of other specified B group vitamins: Secondary | ICD-10-CM | POA: Diagnosis not present

## 2017-10-22 DIAGNOSIS — E538 Deficiency of other specified B group vitamins: Secondary | ICD-10-CM | POA: Diagnosis not present

## 2017-11-05 DIAGNOSIS — S0083XA Contusion of other part of head, initial encounter: Secondary | ICD-10-CM | POA: Diagnosis not present

## 2017-11-05 DIAGNOSIS — S0990XA Unspecified injury of head, initial encounter: Secondary | ICD-10-CM | POA: Diagnosis not present

## 2017-12-15 DIAGNOSIS — E538 Deficiency of other specified B group vitamins: Secondary | ICD-10-CM | POA: Diagnosis not present

## 2018-02-02 DIAGNOSIS — M722 Plantar fascial fibromatosis: Secondary | ICD-10-CM | POA: Diagnosis not present

## 2018-02-02 DIAGNOSIS — M2041 Other hammer toe(s) (acquired), right foot: Secondary | ICD-10-CM | POA: Diagnosis not present

## 2018-02-02 DIAGNOSIS — M65871 Other synovitis and tenosynovitis, right ankle and foot: Secondary | ICD-10-CM | POA: Diagnosis not present

## 2018-02-05 DIAGNOSIS — E538 Deficiency of other specified B group vitamins: Secondary | ICD-10-CM | POA: Diagnosis not present

## 2018-02-18 DIAGNOSIS — M71571 Other bursitis, not elsewhere classified, right ankle and foot: Secondary | ICD-10-CM | POA: Diagnosis not present

## 2018-02-18 DIAGNOSIS — M722 Plantar fascial fibromatosis: Secondary | ICD-10-CM | POA: Diagnosis not present

## 2018-03-16 DIAGNOSIS — E538 Deficiency of other specified B group vitamins: Secondary | ICD-10-CM | POA: Diagnosis not present

## 2018-04-24 DIAGNOSIS — E538 Deficiency of other specified B group vitamins: Secondary | ICD-10-CM | POA: Diagnosis not present

## 2018-05-27 DIAGNOSIS — E538 Deficiency of other specified B group vitamins: Secondary | ICD-10-CM | POA: Diagnosis not present

## 2018-06-09 DIAGNOSIS — E876 Hypokalemia: Secondary | ICD-10-CM | POA: Diagnosis not present

## 2018-06-09 DIAGNOSIS — E782 Mixed hyperlipidemia: Secondary | ICD-10-CM | POA: Diagnosis not present

## 2018-06-09 DIAGNOSIS — I10 Essential (primary) hypertension: Secondary | ICD-10-CM | POA: Diagnosis not present

## 2018-06-09 DIAGNOSIS — M109 Gout, unspecified: Secondary | ICD-10-CM | POA: Diagnosis not present

## 2018-06-09 DIAGNOSIS — D509 Iron deficiency anemia, unspecified: Secondary | ICD-10-CM | POA: Diagnosis not present

## 2018-06-09 DIAGNOSIS — I480 Paroxysmal atrial fibrillation: Secondary | ICD-10-CM | POA: Diagnosis not present

## 2018-06-09 DIAGNOSIS — E538 Deficiency of other specified B group vitamins: Secondary | ICD-10-CM | POA: Diagnosis not present

## 2018-06-09 DIAGNOSIS — Z8349 Family history of other endocrine, nutritional and metabolic diseases: Secondary | ICD-10-CM | POA: Diagnosis not present

## 2018-07-02 DIAGNOSIS — E538 Deficiency of other specified B group vitamins: Secondary | ICD-10-CM | POA: Diagnosis not present

## 2018-08-04 DIAGNOSIS — E538 Deficiency of other specified B group vitamins: Secondary | ICD-10-CM | POA: Diagnosis not present

## 2018-08-11 DIAGNOSIS — H43812 Vitreous degeneration, left eye: Secondary | ICD-10-CM | POA: Diagnosis not present

## 2018-08-12 DIAGNOSIS — Z01812 Encounter for preprocedural laboratory examination: Secondary | ICD-10-CM | POA: Diagnosis not present

## 2018-08-12 DIAGNOSIS — M2041 Other hammer toe(s) (acquired), right foot: Secondary | ICD-10-CM | POA: Diagnosis not present

## 2018-08-12 DIAGNOSIS — Z79899 Other long term (current) drug therapy: Secondary | ICD-10-CM | POA: Diagnosis not present

## 2018-08-12 DIAGNOSIS — M10071 Idiopathic gout, right ankle and foot: Secondary | ICD-10-CM | POA: Diagnosis not present

## 2018-08-12 DIAGNOSIS — L03031 Cellulitis of right toe: Secondary | ICD-10-CM | POA: Diagnosis not present

## 2018-08-16 DIAGNOSIS — T364X5A Adverse effect of tetracyclines, initial encounter: Secondary | ICD-10-CM | POA: Diagnosis not present

## 2018-08-16 DIAGNOSIS — M109 Gout, unspecified: Secondary | ICD-10-CM | POA: Diagnosis not present

## 2018-08-31 DIAGNOSIS — L97512 Non-pressure chronic ulcer of other part of right foot with fat layer exposed: Secondary | ICD-10-CM | POA: Diagnosis not present

## 2018-09-07 DIAGNOSIS — E538 Deficiency of other specified B group vitamins: Secondary | ICD-10-CM | POA: Diagnosis not present

## 2018-09-21 DIAGNOSIS — M25571 Pain in right ankle and joints of right foot: Secondary | ICD-10-CM | POA: Diagnosis not present

## 2018-09-23 DIAGNOSIS — Z23 Encounter for immunization: Secondary | ICD-10-CM | POA: Diagnosis not present

## 2018-09-23 DIAGNOSIS — L821 Other seborrheic keratosis: Secondary | ICD-10-CM | POA: Diagnosis not present

## 2018-10-12 DIAGNOSIS — L97511 Non-pressure chronic ulcer of other part of right foot limited to breakdown of skin: Secondary | ICD-10-CM | POA: Diagnosis not present

## 2018-10-12 DIAGNOSIS — M10079 Idiopathic gout, unspecified ankle and foot: Secondary | ICD-10-CM | POA: Diagnosis not present

## 2018-10-23 DIAGNOSIS — E538 Deficiency of other specified B group vitamins: Secondary | ICD-10-CM | POA: Diagnosis not present

## 2018-11-23 DIAGNOSIS — E538 Deficiency of other specified B group vitamins: Secondary | ICD-10-CM | POA: Diagnosis not present

## 2018-12-14 DIAGNOSIS — M12271 Villonodular synovitis (pigmented), right ankle and foot: Secondary | ICD-10-CM | POA: Diagnosis not present

## 2018-12-14 DIAGNOSIS — M10071 Idiopathic gout, right ankle and foot: Secondary | ICD-10-CM | POA: Diagnosis not present

## 2018-12-14 DIAGNOSIS — M12272 Villonodular synovitis (pigmented), left ankle and foot: Secondary | ICD-10-CM | POA: Diagnosis not present

## 2018-12-14 DIAGNOSIS — M65871 Other synovitis and tenosynovitis, right ankle and foot: Secondary | ICD-10-CM | POA: Diagnosis not present

## 2018-12-14 DIAGNOSIS — M65872 Other synovitis and tenosynovitis, left ankle and foot: Secondary | ICD-10-CM | POA: Diagnosis not present

## 2018-12-14 DIAGNOSIS — M10079 Idiopathic gout, unspecified ankle and foot: Secondary | ICD-10-CM | POA: Diagnosis not present

## 2018-12-30 DIAGNOSIS — E538 Deficiency of other specified B group vitamins: Secondary | ICD-10-CM | POA: Diagnosis not present

## 2019-02-02 DIAGNOSIS — E538 Deficiency of other specified B group vitamins: Secondary | ICD-10-CM | POA: Diagnosis not present

## 2019-03-06 DIAGNOSIS — H6122 Impacted cerumen, left ear: Secondary | ICD-10-CM | POA: Diagnosis not present

## 2019-03-06 DIAGNOSIS — M109 Gout, unspecified: Secondary | ICD-10-CM | POA: Diagnosis not present

## 2019-03-06 DIAGNOSIS — H6980 Other specified disorders of Eustachian tube, unspecified ear: Secondary | ICD-10-CM | POA: Diagnosis not present

## 2019-03-12 DIAGNOSIS — M109 Gout, unspecified: Secondary | ICD-10-CM | POA: Diagnosis not present

## 2019-03-12 DIAGNOSIS — E876 Hypokalemia: Secondary | ICD-10-CM | POA: Diagnosis not present

## 2019-03-23 DIAGNOSIS — E538 Deficiency of other specified B group vitamins: Secondary | ICD-10-CM | POA: Diagnosis not present

## 2019-04-30 DIAGNOSIS — E538 Deficiency of other specified B group vitamins: Secondary | ICD-10-CM | POA: Diagnosis not present

## 2019-06-01 DIAGNOSIS — M109 Gout, unspecified: Secondary | ICD-10-CM | POA: Diagnosis not present

## 2019-06-04 ENCOUNTER — Encounter: Payer: Self-pay | Admitting: *Deleted

## 2019-06-07 DIAGNOSIS — E538 Deficiency of other specified B group vitamins: Secondary | ICD-10-CM | POA: Diagnosis not present

## 2019-06-10 ENCOUNTER — Encounter: Payer: Self-pay | Admitting: Internal Medicine

## 2019-07-20 DIAGNOSIS — E538 Deficiency of other specified B group vitamins: Secondary | ICD-10-CM | POA: Diagnosis not present

## 2019-08-16 DIAGNOSIS — L814 Other melanin hyperpigmentation: Secondary | ICD-10-CM | POA: Diagnosis not present

## 2019-08-16 DIAGNOSIS — L820 Inflamed seborrheic keratosis: Secondary | ICD-10-CM | POA: Diagnosis not present

## 2019-08-16 DIAGNOSIS — L821 Other seborrheic keratosis: Secondary | ICD-10-CM | POA: Diagnosis not present

## 2019-08-16 DIAGNOSIS — D225 Melanocytic nevi of trunk: Secondary | ICD-10-CM | POA: Diagnosis not present

## 2019-08-16 DIAGNOSIS — D485 Neoplasm of uncertain behavior of skin: Secondary | ICD-10-CM | POA: Diagnosis not present

## 2019-08-16 DIAGNOSIS — Z85828 Personal history of other malignant neoplasm of skin: Secondary | ICD-10-CM | POA: Diagnosis not present

## 2019-08-16 DIAGNOSIS — L853 Xerosis cutis: Secondary | ICD-10-CM | POA: Diagnosis not present

## 2019-09-02 DIAGNOSIS — H2512 Age-related nuclear cataract, left eye: Secondary | ICD-10-CM | POA: Diagnosis not present

## 2019-09-02 DIAGNOSIS — Z961 Presence of intraocular lens: Secondary | ICD-10-CM | POA: Diagnosis not present

## 2019-09-02 DIAGNOSIS — H5212 Myopia, left eye: Secondary | ICD-10-CM | POA: Diagnosis not present

## 2019-09-02 DIAGNOSIS — H52202 Unspecified astigmatism, left eye: Secondary | ICD-10-CM | POA: Diagnosis not present

## 2019-09-09 DIAGNOSIS — E538 Deficiency of other specified B group vitamins: Secondary | ICD-10-CM | POA: Diagnosis not present

## 2019-10-14 DIAGNOSIS — E538 Deficiency of other specified B group vitamins: Secondary | ICD-10-CM | POA: Diagnosis not present

## 2019-11-03 DIAGNOSIS — Z23 Encounter for immunization: Secondary | ICD-10-CM | POA: Diagnosis not present

## 2019-11-17 DIAGNOSIS — E782 Mixed hyperlipidemia: Secondary | ICD-10-CM | POA: Diagnosis not present

## 2019-11-17 DIAGNOSIS — I480 Paroxysmal atrial fibrillation: Secondary | ICD-10-CM | POA: Diagnosis not present

## 2019-11-17 DIAGNOSIS — I10 Essential (primary) hypertension: Secondary | ICD-10-CM | POA: Diagnosis not present

## 2019-11-17 DIAGNOSIS — D509 Iron deficiency anemia, unspecified: Secondary | ICD-10-CM | POA: Diagnosis not present

## 2019-12-08 ENCOUNTER — Encounter: Payer: Self-pay | Admitting: General Practice

## 2020-01-05 DIAGNOSIS — D509 Iron deficiency anemia, unspecified: Secondary | ICD-10-CM | POA: Diagnosis not present

## 2020-01-05 DIAGNOSIS — M109 Gout, unspecified: Secondary | ICD-10-CM | POA: Diagnosis not present

## 2020-01-05 DIAGNOSIS — E876 Hypokalemia: Secondary | ICD-10-CM | POA: Diagnosis not present

## 2020-01-05 DIAGNOSIS — E538 Deficiency of other specified B group vitamins: Secondary | ICD-10-CM | POA: Diagnosis not present

## 2020-01-05 DIAGNOSIS — E782 Mixed hyperlipidemia: Secondary | ICD-10-CM | POA: Diagnosis not present

## 2020-01-05 DIAGNOSIS — I10 Essential (primary) hypertension: Secondary | ICD-10-CM | POA: Diagnosis not present

## 2020-01-05 DIAGNOSIS — Z8349 Family history of other endocrine, nutritional and metabolic diseases: Secondary | ICD-10-CM | POA: Diagnosis not present

## 2020-01-07 DIAGNOSIS — I10 Essential (primary) hypertension: Secondary | ICD-10-CM | POA: Diagnosis not present

## 2020-01-07 DIAGNOSIS — I480 Paroxysmal atrial fibrillation: Secondary | ICD-10-CM | POA: Diagnosis not present

## 2020-01-07 DIAGNOSIS — E782 Mixed hyperlipidemia: Secondary | ICD-10-CM | POA: Diagnosis not present

## 2020-01-07 DIAGNOSIS — D509 Iron deficiency anemia, unspecified: Secondary | ICD-10-CM | POA: Diagnosis not present

## 2020-01-17 DIAGNOSIS — E538 Deficiency of other specified B group vitamins: Secondary | ICD-10-CM | POA: Diagnosis not present

## 2020-01-26 DIAGNOSIS — M109 Gout, unspecified: Secondary | ICD-10-CM | POA: Diagnosis not present

## 2020-02-07 DIAGNOSIS — E782 Mixed hyperlipidemia: Secondary | ICD-10-CM | POA: Diagnosis not present

## 2020-02-07 DIAGNOSIS — D509 Iron deficiency anemia, unspecified: Secondary | ICD-10-CM | POA: Diagnosis not present

## 2020-02-07 DIAGNOSIS — R109 Unspecified abdominal pain: Secondary | ICD-10-CM | POA: Diagnosis not present

## 2020-02-07 DIAGNOSIS — R1010 Upper abdominal pain, unspecified: Secondary | ICD-10-CM | POA: Diagnosis not present

## 2020-02-07 DIAGNOSIS — I480 Paroxysmal atrial fibrillation: Secondary | ICD-10-CM | POA: Diagnosis not present

## 2020-02-07 DIAGNOSIS — I10 Essential (primary) hypertension: Secondary | ICD-10-CM | POA: Diagnosis not present

## 2020-02-12 ENCOUNTER — Other Ambulatory Visit: Payer: Self-pay | Admitting: Family Medicine

## 2020-02-12 DIAGNOSIS — R109 Unspecified abdominal pain: Secondary | ICD-10-CM

## 2020-02-23 ENCOUNTER — Encounter: Payer: Self-pay | Admitting: General Practice

## 2020-02-23 DIAGNOSIS — E538 Deficiency of other specified B group vitamins: Secondary | ICD-10-CM | POA: Diagnosis not present

## 2020-02-28 ENCOUNTER — Ambulatory Visit: Payer: Medicare Other | Admitting: Interventional Cardiology

## 2020-03-01 ENCOUNTER — Other Ambulatory Visit: Payer: Medicare Other

## 2020-03-27 DIAGNOSIS — E538 Deficiency of other specified B group vitamins: Secondary | ICD-10-CM | POA: Diagnosis not present

## 2020-04-26 DIAGNOSIS — D510 Vitamin B12 deficiency anemia due to intrinsic factor deficiency: Secondary | ICD-10-CM | POA: Diagnosis not present

## 2020-05-26 DIAGNOSIS — E538 Deficiency of other specified B group vitamins: Secondary | ICD-10-CM | POA: Diagnosis not present

## 2020-05-30 DIAGNOSIS — L298 Other pruritus: Secondary | ICD-10-CM | POA: Diagnosis not present

## 2020-05-30 DIAGNOSIS — M109 Gout, unspecified: Secondary | ICD-10-CM | POA: Diagnosis not present

## 2020-07-31 DIAGNOSIS — E538 Deficiency of other specified B group vitamins: Secondary | ICD-10-CM | POA: Diagnosis not present

## 2020-08-23 DIAGNOSIS — E559 Vitamin D deficiency, unspecified: Secondary | ICD-10-CM | POA: Diagnosis not present

## 2020-08-23 DIAGNOSIS — M109 Gout, unspecified: Secondary | ICD-10-CM | POA: Diagnosis not present

## 2020-08-23 DIAGNOSIS — D509 Iron deficiency anemia, unspecified: Secondary | ICD-10-CM | POA: Diagnosis not present

## 2020-08-23 DIAGNOSIS — E876 Hypokalemia: Secondary | ICD-10-CM | POA: Diagnosis not present

## 2020-08-23 DIAGNOSIS — E538 Deficiency of other specified B group vitamins: Secondary | ICD-10-CM | POA: Diagnosis not present

## 2020-09-01 DIAGNOSIS — D510 Vitamin B12 deficiency anemia due to intrinsic factor deficiency: Secondary | ICD-10-CM | POA: Diagnosis not present

## 2020-09-21 DIAGNOSIS — M86172 Other acute osteomyelitis, left ankle and foot: Secondary | ICD-10-CM | POA: Diagnosis not present

## 2020-09-21 DIAGNOSIS — L03032 Cellulitis of left toe: Secondary | ICD-10-CM | POA: Diagnosis not present

## 2020-09-21 DIAGNOSIS — Z79899 Other long term (current) drug therapy: Secondary | ICD-10-CM | POA: Diagnosis not present

## 2020-09-28 ENCOUNTER — Other Ambulatory Visit (HOSPITAL_COMMUNITY): Payer: Self-pay | Admitting: Family Medicine

## 2020-09-28 DIAGNOSIS — I480 Paroxysmal atrial fibrillation: Secondary | ICD-10-CM | POA: Diagnosis not present

## 2020-09-28 DIAGNOSIS — D510 Vitamin B12 deficiency anemia due to intrinsic factor deficiency: Secondary | ICD-10-CM | POA: Diagnosis not present

## 2020-09-28 DIAGNOSIS — M109 Gout, unspecified: Secondary | ICD-10-CM | POA: Diagnosis not present

## 2020-09-28 DIAGNOSIS — Z1152 Encounter for screening for COVID-19: Secondary | ICD-10-CM | POA: Diagnosis not present

## 2020-09-28 DIAGNOSIS — R635 Abnormal weight gain: Secondary | ICD-10-CM | POA: Diagnosis not present

## 2020-09-28 DIAGNOSIS — E559 Vitamin D deficiency, unspecified: Secondary | ICD-10-CM | POA: Diagnosis not present

## 2020-09-28 DIAGNOSIS — R7309 Other abnormal glucose: Secondary | ICD-10-CM | POA: Diagnosis not present

## 2020-09-28 DIAGNOSIS — E782 Mixed hyperlipidemia: Secondary | ICD-10-CM | POA: Diagnosis not present

## 2020-09-28 DIAGNOSIS — R0989 Other specified symptoms and signs involving the circulatory and respiratory systems: Secondary | ICD-10-CM

## 2020-09-28 DIAGNOSIS — I10 Essential (primary) hypertension: Secondary | ICD-10-CM | POA: Diagnosis not present

## 2020-09-28 DIAGNOSIS — E538 Deficiency of other specified B group vitamins: Secondary | ICD-10-CM | POA: Diagnosis not present

## 2020-09-28 DIAGNOSIS — D6869 Other thrombophilia: Secondary | ICD-10-CM | POA: Diagnosis not present

## 2020-09-28 DIAGNOSIS — Z8349 Family history of other endocrine, nutritional and metabolic diseases: Secondary | ICD-10-CM | POA: Diagnosis not present

## 2020-09-29 DIAGNOSIS — L97522 Non-pressure chronic ulcer of other part of left foot with fat layer exposed: Secondary | ICD-10-CM | POA: Diagnosis not present

## 2020-10-03 DIAGNOSIS — D510 Vitamin B12 deficiency anemia due to intrinsic factor deficiency: Secondary | ICD-10-CM | POA: Diagnosis not present

## 2020-10-04 ENCOUNTER — Ambulatory Visit (HOSPITAL_COMMUNITY): Payer: Medicare Other

## 2020-10-16 DIAGNOSIS — M25572 Pain in left ankle and joints of left foot: Secondary | ICD-10-CM | POA: Diagnosis not present

## 2020-10-16 DIAGNOSIS — M10079 Idiopathic gout, unspecified ankle and foot: Secondary | ICD-10-CM | POA: Diagnosis not present

## 2020-11-02 DIAGNOSIS — E538 Deficiency of other specified B group vitamins: Secondary | ICD-10-CM | POA: Diagnosis not present

## 2020-11-29 DIAGNOSIS — M79672 Pain in left foot: Secondary | ICD-10-CM | POA: Diagnosis not present

## 2020-11-29 DIAGNOSIS — M19042 Primary osteoarthritis, left hand: Secondary | ICD-10-CM | POA: Diagnosis not present

## 2020-11-29 DIAGNOSIS — M25572 Pain in left ankle and joints of left foot: Secondary | ICD-10-CM | POA: Diagnosis not present

## 2020-11-29 DIAGNOSIS — M109 Gout, unspecified: Secondary | ICD-10-CM | POA: Diagnosis not present

## 2020-11-29 DIAGNOSIS — M79643 Pain in unspecified hand: Secondary | ICD-10-CM | POA: Diagnosis not present

## 2020-11-29 DIAGNOSIS — M79671 Pain in right foot: Secondary | ICD-10-CM | POA: Diagnosis not present

## 2020-11-29 DIAGNOSIS — M1990 Unspecified osteoarthritis, unspecified site: Secondary | ICD-10-CM | POA: Diagnosis not present

## 2020-11-29 DIAGNOSIS — M19072 Primary osteoarthritis, left ankle and foot: Secondary | ICD-10-CM | POA: Diagnosis not present

## 2020-11-29 DIAGNOSIS — M19041 Primary osteoarthritis, right hand: Secondary | ICD-10-CM | POA: Diagnosis not present

## 2020-11-29 DIAGNOSIS — M79641 Pain in right hand: Secondary | ICD-10-CM | POA: Diagnosis not present

## 2020-11-29 DIAGNOSIS — M25571 Pain in right ankle and joints of right foot: Secondary | ICD-10-CM | POA: Diagnosis not present

## 2020-11-29 DIAGNOSIS — M79642 Pain in left hand: Secondary | ICD-10-CM | POA: Diagnosis not present

## 2020-11-29 DIAGNOSIS — M19071 Primary osteoarthritis, right ankle and foot: Secondary | ICD-10-CM | POA: Diagnosis not present

## 2020-12-04 DIAGNOSIS — D510 Vitamin B12 deficiency anemia due to intrinsic factor deficiency: Secondary | ICD-10-CM | POA: Diagnosis not present

## 2020-12-13 DIAGNOSIS — M79671 Pain in right foot: Secondary | ICD-10-CM | POA: Diagnosis not present

## 2020-12-13 DIAGNOSIS — M109 Gout, unspecified: Secondary | ICD-10-CM | POA: Diagnosis not present

## 2020-12-13 DIAGNOSIS — M79643 Pain in unspecified hand: Secondary | ICD-10-CM | POA: Diagnosis not present

## 2020-12-13 DIAGNOSIS — M1990 Unspecified osteoarthritis, unspecified site: Secondary | ICD-10-CM | POA: Diagnosis not present

## 2020-12-19 DIAGNOSIS — I480 Paroxysmal atrial fibrillation: Secondary | ICD-10-CM | POA: Diagnosis not present

## 2020-12-19 DIAGNOSIS — I10 Essential (primary) hypertension: Secondary | ICD-10-CM | POA: Diagnosis not present

## 2020-12-19 DIAGNOSIS — E782 Mixed hyperlipidemia: Secondary | ICD-10-CM | POA: Diagnosis not present

## 2020-12-19 DIAGNOSIS — D509 Iron deficiency anemia, unspecified: Secondary | ICD-10-CM | POA: Diagnosis not present

## 2020-12-19 DIAGNOSIS — D510 Vitamin B12 deficiency anemia due to intrinsic factor deficiency: Secondary | ICD-10-CM | POA: Diagnosis not present

## 2021-01-18 DIAGNOSIS — M109 Gout, unspecified: Secondary | ICD-10-CM | POA: Diagnosis not present

## 2021-01-18 DIAGNOSIS — M79643 Pain in unspecified hand: Secondary | ICD-10-CM | POA: Diagnosis not present

## 2021-01-18 DIAGNOSIS — M25572 Pain in left ankle and joints of left foot: Secondary | ICD-10-CM | POA: Diagnosis not present

## 2021-01-18 DIAGNOSIS — M1990 Unspecified osteoarthritis, unspecified site: Secondary | ICD-10-CM | POA: Diagnosis not present

## 2021-01-18 DIAGNOSIS — M779 Enthesopathy, unspecified: Secondary | ICD-10-CM | POA: Diagnosis not present

## 2021-01-18 DIAGNOSIS — M79671 Pain in right foot: Secondary | ICD-10-CM | POA: Diagnosis not present

## 2021-02-06 DIAGNOSIS — R635 Abnormal weight gain: Secondary | ICD-10-CM | POA: Diagnosis not present

## 2021-02-06 DIAGNOSIS — D6869 Other thrombophilia: Secondary | ICD-10-CM | POA: Diagnosis not present

## 2021-02-06 DIAGNOSIS — E782 Mixed hyperlipidemia: Secondary | ICD-10-CM | POA: Diagnosis not present

## 2021-02-06 DIAGNOSIS — R0989 Other specified symptoms and signs involving the circulatory and respiratory systems: Secondary | ICD-10-CM | POA: Diagnosis not present

## 2021-02-06 DIAGNOSIS — R7309 Other abnormal glucose: Secondary | ICD-10-CM | POA: Diagnosis not present

## 2021-02-06 DIAGNOSIS — I480 Paroxysmal atrial fibrillation: Secondary | ICD-10-CM | POA: Diagnosis not present

## 2021-02-06 DIAGNOSIS — D510 Vitamin B12 deficiency anemia due to intrinsic factor deficiency: Secondary | ICD-10-CM | POA: Diagnosis not present

## 2021-02-06 DIAGNOSIS — Z1152 Encounter for screening for COVID-19: Secondary | ICD-10-CM | POA: Diagnosis not present

## 2021-02-06 DIAGNOSIS — E538 Deficiency of other specified B group vitamins: Secondary | ICD-10-CM | POA: Diagnosis not present

## 2021-02-06 DIAGNOSIS — E559 Vitamin D deficiency, unspecified: Secondary | ICD-10-CM | POA: Diagnosis not present

## 2021-02-06 DIAGNOSIS — I10 Essential (primary) hypertension: Secondary | ICD-10-CM | POA: Diagnosis not present

## 2021-02-06 DIAGNOSIS — M109 Gout, unspecified: Secondary | ICD-10-CM | POA: Diagnosis not present

## 2021-03-12 DIAGNOSIS — E538 Deficiency of other specified B group vitamins: Secondary | ICD-10-CM | POA: Diagnosis not present

## 2021-05-28 DIAGNOSIS — M1990 Unspecified osteoarthritis, unspecified site: Secondary | ICD-10-CM | POA: Diagnosis not present

## 2021-05-28 DIAGNOSIS — M109 Gout, unspecified: Secondary | ICD-10-CM | POA: Diagnosis not present

## 2021-05-28 DIAGNOSIS — M779 Enthesopathy, unspecified: Secondary | ICD-10-CM | POA: Diagnosis not present

## 2021-05-28 DIAGNOSIS — E538 Deficiency of other specified B group vitamins: Secondary | ICD-10-CM | POA: Diagnosis not present

## 2021-05-28 DIAGNOSIS — M79643 Pain in unspecified hand: Secondary | ICD-10-CM | POA: Diagnosis not present

## 2021-06-14 DIAGNOSIS — U071 COVID-19: Secondary | ICD-10-CM | POA: Diagnosis not present

## 2021-06-15 DIAGNOSIS — R102 Pelvic and perineal pain: Secondary | ICD-10-CM | POA: Diagnosis not present

## 2021-06-15 DIAGNOSIS — R300 Dysuria: Secondary | ICD-10-CM | POA: Diagnosis not present

## 2021-06-15 DIAGNOSIS — N3000 Acute cystitis without hematuria: Secondary | ICD-10-CM | POA: Diagnosis not present

## 2021-06-29 DIAGNOSIS — E538 Deficiency of other specified B group vitamins: Secondary | ICD-10-CM | POA: Diagnosis not present

## 2021-08-07 DIAGNOSIS — E538 Deficiency of other specified B group vitamins: Secondary | ICD-10-CM | POA: Diagnosis not present

## 2021-08-23 DIAGNOSIS — M1990 Unspecified osteoarthritis, unspecified site: Secondary | ICD-10-CM | POA: Diagnosis not present

## 2021-08-23 DIAGNOSIS — M779 Enthesopathy, unspecified: Secondary | ICD-10-CM | POA: Diagnosis not present

## 2021-08-23 DIAGNOSIS — M79643 Pain in unspecified hand: Secondary | ICD-10-CM | POA: Diagnosis not present

## 2021-08-23 DIAGNOSIS — M109 Gout, unspecified: Secondary | ICD-10-CM | POA: Diagnosis not present

## 2021-09-03 DIAGNOSIS — R300 Dysuria: Secondary | ICD-10-CM | POA: Diagnosis not present

## 2021-09-03 DIAGNOSIS — Z6836 Body mass index (BMI) 36.0-36.9, adult: Secondary | ICD-10-CM | POA: Diagnosis not present

## 2021-09-03 DIAGNOSIS — R350 Frequency of micturition: Secondary | ICD-10-CM | POA: Diagnosis not present

## 2021-09-10 DIAGNOSIS — L719 Rosacea, unspecified: Secondary | ICD-10-CM | POA: Diagnosis not present

## 2021-09-10 DIAGNOSIS — L814 Other melanin hyperpigmentation: Secondary | ICD-10-CM | POA: Diagnosis not present

## 2021-09-10 DIAGNOSIS — L578 Other skin changes due to chronic exposure to nonionizing radiation: Secondary | ICD-10-CM | POA: Diagnosis not present

## 2021-09-10 DIAGNOSIS — D225 Melanocytic nevi of trunk: Secondary | ICD-10-CM | POA: Diagnosis not present

## 2021-09-10 DIAGNOSIS — Z85828 Personal history of other malignant neoplasm of skin: Secondary | ICD-10-CM | POA: Diagnosis not present

## 2021-09-10 DIAGNOSIS — L821 Other seborrheic keratosis: Secondary | ICD-10-CM | POA: Diagnosis not present

## 2021-09-10 DIAGNOSIS — L853 Xerosis cutis: Secondary | ICD-10-CM | POA: Diagnosis not present

## 2021-09-14 DIAGNOSIS — E538 Deficiency of other specified B group vitamins: Secondary | ICD-10-CM | POA: Diagnosis not present

## 2021-09-27 DIAGNOSIS — M720 Palmar fascial fibromatosis [Dupuytren]: Secondary | ICD-10-CM | POA: Diagnosis not present

## 2021-09-27 DIAGNOSIS — R2230 Localized swelling, mass and lump, unspecified upper limb: Secondary | ICD-10-CM | POA: Diagnosis not present

## 2021-10-12 DIAGNOSIS — Z0000 Encounter for general adult medical examination without abnormal findings: Secondary | ICD-10-CM | POA: Diagnosis not present

## 2021-10-12 DIAGNOSIS — E876 Hypokalemia: Secondary | ICD-10-CM | POA: Diagnosis not present

## 2021-10-12 DIAGNOSIS — D6869 Other thrombophilia: Secondary | ICD-10-CM | POA: Diagnosis not present

## 2021-10-12 DIAGNOSIS — I480 Paroxysmal atrial fibrillation: Secondary | ICD-10-CM | POA: Diagnosis not present

## 2021-10-12 DIAGNOSIS — Z6836 Body mass index (BMI) 36.0-36.9, adult: Secondary | ICD-10-CM | POA: Diagnosis not present

## 2021-10-12 DIAGNOSIS — E782 Mixed hyperlipidemia: Secondary | ICD-10-CM | POA: Diagnosis not present

## 2021-10-12 DIAGNOSIS — I10 Essential (primary) hypertension: Secondary | ICD-10-CM | POA: Diagnosis not present

## 2021-10-12 DIAGNOSIS — E538 Deficiency of other specified B group vitamins: Secondary | ICD-10-CM | POA: Diagnosis not present

## 2021-10-12 DIAGNOSIS — E1169 Type 2 diabetes mellitus with other specified complication: Secondary | ICD-10-CM | POA: Diagnosis not present

## 2021-10-12 DIAGNOSIS — D510 Vitamin B12 deficiency anemia due to intrinsic factor deficiency: Secondary | ICD-10-CM | POA: Diagnosis not present

## 2021-11-13 DIAGNOSIS — E538 Deficiency of other specified B group vitamins: Secondary | ICD-10-CM | POA: Diagnosis not present

## 2021-11-20 DIAGNOSIS — M5459 Other low back pain: Secondary | ICD-10-CM | POA: Diagnosis not present

## 2021-11-20 DIAGNOSIS — M5136 Other intervertebral disc degeneration, lumbar region: Secondary | ICD-10-CM | POA: Diagnosis not present

## 2021-11-20 DIAGNOSIS — M4856XA Collapsed vertebra, not elsewhere classified, lumbar region, initial encounter for fracture: Secondary | ICD-10-CM | POA: Diagnosis not present

## 2021-11-20 DIAGNOSIS — M5451 Vertebrogenic low back pain: Secondary | ICD-10-CM | POA: Diagnosis not present

## 2021-11-27 DIAGNOSIS — M79643 Pain in unspecified hand: Secondary | ICD-10-CM | POA: Diagnosis not present

## 2021-11-27 DIAGNOSIS — M779 Enthesopathy, unspecified: Secondary | ICD-10-CM | POA: Diagnosis not present

## 2021-11-27 DIAGNOSIS — M1990 Unspecified osteoarthritis, unspecified site: Secondary | ICD-10-CM | POA: Diagnosis not present

## 2021-11-27 DIAGNOSIS — M109 Gout, unspecified: Secondary | ICD-10-CM | POA: Diagnosis not present

## 2021-11-30 DIAGNOSIS — M5459 Other low back pain: Secondary | ICD-10-CM | POA: Diagnosis not present

## 2021-11-30 DIAGNOSIS — M5416 Radiculopathy, lumbar region: Secondary | ICD-10-CM | POA: Diagnosis not present

## 2021-12-03 DIAGNOSIS — M5416 Radiculopathy, lumbar region: Secondary | ICD-10-CM | POA: Diagnosis not present

## 2021-12-03 DIAGNOSIS — M4856XD Collapsed vertebra, not elsewhere classified, lumbar region, subsequent encounter for fracture with routine healing: Secondary | ICD-10-CM | POA: Diagnosis not present

## 2021-12-12 DIAGNOSIS — E538 Deficiency of other specified B group vitamins: Secondary | ICD-10-CM | POA: Diagnosis not present

## 2021-12-28 DIAGNOSIS — M5451 Vertebrogenic low back pain: Secondary | ICD-10-CM | POA: Diagnosis not present

## 2022-01-09 DIAGNOSIS — E538 Deficiency of other specified B group vitamins: Secondary | ICD-10-CM | POA: Diagnosis not present

## 2022-01-29 DIAGNOSIS — H1820 Unspecified corneal edema: Secondary | ICD-10-CM | POA: Diagnosis not present

## 2022-01-29 DIAGNOSIS — H04121 Dry eye syndrome of right lacrimal gland: Secondary | ICD-10-CM | POA: Diagnosis not present

## 2022-01-29 DIAGNOSIS — H2512 Age-related nuclear cataract, left eye: Secondary | ICD-10-CM | POA: Diagnosis not present

## 2022-01-29 DIAGNOSIS — H10413 Chronic giant papillary conjunctivitis, bilateral: Secondary | ICD-10-CM | POA: Diagnosis not present

## 2022-02-06 DIAGNOSIS — E538 Deficiency of other specified B group vitamins: Secondary | ICD-10-CM | POA: Diagnosis not present

## 2022-02-08 DIAGNOSIS — M5451 Vertebrogenic low back pain: Secondary | ICD-10-CM | POA: Diagnosis not present

## 2022-02-19 DIAGNOSIS — L820 Inflamed seborrheic keratosis: Secondary | ICD-10-CM | POA: Diagnosis not present

## 2022-02-19 DIAGNOSIS — Z23 Encounter for immunization: Secondary | ICD-10-CM | POA: Diagnosis not present

## 2022-03-04 DIAGNOSIS — E538 Deficiency of other specified B group vitamins: Secondary | ICD-10-CM | POA: Diagnosis not present

## 2022-03-07 DIAGNOSIS — H2512 Age-related nuclear cataract, left eye: Secondary | ICD-10-CM | POA: Diagnosis not present

## 2022-03-07 DIAGNOSIS — H16223 Keratoconjunctivitis sicca, not specified as Sjogren's, bilateral: Secondary | ICD-10-CM | POA: Diagnosis not present

## 2022-03-21 DIAGNOSIS — M5451 Vertebrogenic low back pain: Secondary | ICD-10-CM | POA: Diagnosis not present

## 2022-03-27 ENCOUNTER — Encounter: Payer: Self-pay | Admitting: Internal Medicine

## 2022-04-02 DIAGNOSIS — E538 Deficiency of other specified B group vitamins: Secondary | ICD-10-CM | POA: Diagnosis not present

## 2022-04-10 DIAGNOSIS — L918 Other hypertrophic disorders of the skin: Secondary | ICD-10-CM | POA: Diagnosis not present

## 2022-04-10 DIAGNOSIS — L820 Inflamed seborrheic keratosis: Secondary | ICD-10-CM | POA: Diagnosis not present

## 2022-04-30 DIAGNOSIS — E538 Deficiency of other specified B group vitamins: Secondary | ICD-10-CM | POA: Diagnosis not present

## 2022-05-31 DIAGNOSIS — E538 Deficiency of other specified B group vitamins: Secondary | ICD-10-CM | POA: Diagnosis not present

## 2022-06-28 DIAGNOSIS — E538 Deficiency of other specified B group vitamins: Secondary | ICD-10-CM | POA: Diagnosis not present

## 2022-07-25 DIAGNOSIS — E538 Deficiency of other specified B group vitamins: Secondary | ICD-10-CM | POA: Diagnosis not present

## 2022-09-12 DIAGNOSIS — E538 Deficiency of other specified B group vitamins: Secondary | ICD-10-CM | POA: Diagnosis not present

## 2022-10-17 DIAGNOSIS — E538 Deficiency of other specified B group vitamins: Secondary | ICD-10-CM | POA: Diagnosis not present

## 2022-11-13 DIAGNOSIS — E538 Deficiency of other specified B group vitamins: Secondary | ICD-10-CM | POA: Diagnosis not present

## 2022-12-25 DIAGNOSIS — E538 Deficiency of other specified B group vitamins: Secondary | ICD-10-CM | POA: Diagnosis not present

## 2023-02-05 DIAGNOSIS — I10 Essential (primary) hypertension: Secondary | ICD-10-CM | POA: Diagnosis not present

## 2023-02-05 DIAGNOSIS — E782 Mixed hyperlipidemia: Secondary | ICD-10-CM | POA: Diagnosis not present

## 2023-02-05 DIAGNOSIS — E538 Deficiency of other specified B group vitamins: Secondary | ICD-10-CM | POA: Diagnosis not present

## 2023-02-05 DIAGNOSIS — E1169 Type 2 diabetes mellitus with other specified complication: Secondary | ICD-10-CM | POA: Diagnosis not present

## 2023-02-12 DIAGNOSIS — E876 Hypokalemia: Secondary | ICD-10-CM | POA: Diagnosis not present

## 2023-02-12 DIAGNOSIS — E1169 Type 2 diabetes mellitus with other specified complication: Secondary | ICD-10-CM | POA: Diagnosis not present

## 2023-02-12 DIAGNOSIS — E782 Mixed hyperlipidemia: Secondary | ICD-10-CM | POA: Diagnosis not present

## 2023-02-12 DIAGNOSIS — Z0000 Encounter for general adult medical examination without abnormal findings: Secondary | ICD-10-CM | POA: Diagnosis not present

## 2023-02-12 DIAGNOSIS — Z6835 Body mass index (BMI) 35.0-35.9, adult: Secondary | ICD-10-CM | POA: Diagnosis not present

## 2023-02-12 DIAGNOSIS — E538 Deficiency of other specified B group vitamins: Secondary | ICD-10-CM | POA: Diagnosis not present

## 2023-02-12 DIAGNOSIS — I10 Essential (primary) hypertension: Secondary | ICD-10-CM | POA: Diagnosis not present

## 2023-02-12 DIAGNOSIS — D510 Vitamin B12 deficiency anemia due to intrinsic factor deficiency: Secondary | ICD-10-CM | POA: Diagnosis not present

## 2023-03-07 DIAGNOSIS — E538 Deficiency of other specified B group vitamins: Secondary | ICD-10-CM | POA: Diagnosis not present

## 2023-04-11 DIAGNOSIS — L820 Inflamed seborrheic keratosis: Secondary | ICD-10-CM | POA: Diagnosis not present

## 2023-04-11 DIAGNOSIS — M1A9XX1 Chronic gout, unspecified, with tophus (tophi): Secondary | ICD-10-CM | POA: Diagnosis not present

## 2023-05-23 DIAGNOSIS — E538 Deficiency of other specified B group vitamins: Secondary | ICD-10-CM | POA: Diagnosis not present

## 2023-06-25 DIAGNOSIS — E538 Deficiency of other specified B group vitamins: Secondary | ICD-10-CM | POA: Diagnosis not present

## 2023-06-30 DIAGNOSIS — L820 Inflamed seborrheic keratosis: Secondary | ICD-10-CM | POA: Diagnosis not present

## 2023-06-30 DIAGNOSIS — D692 Other nonthrombocytopenic purpura: Secondary | ICD-10-CM | POA: Diagnosis not present

## 2023-07-25 DIAGNOSIS — E538 Deficiency of other specified B group vitamins: Secondary | ICD-10-CM | POA: Diagnosis not present

## 2023-09-01 DIAGNOSIS — R2689 Other abnormalities of gait and mobility: Secondary | ICD-10-CM | POA: Diagnosis not present

## 2023-09-08 DIAGNOSIS — E1169 Type 2 diabetes mellitus with other specified complication: Secondary | ICD-10-CM | POA: Diagnosis not present

## 2023-09-10 DIAGNOSIS — E538 Deficiency of other specified B group vitamins: Secondary | ICD-10-CM | POA: Diagnosis not present

## 2023-09-10 DIAGNOSIS — Z8639 Personal history of other endocrine, nutritional and metabolic disease: Secondary | ICD-10-CM | POA: Diagnosis not present

## 2023-10-27 DIAGNOSIS — E538 Deficiency of other specified B group vitamins: Secondary | ICD-10-CM | POA: Diagnosis not present

## 2023-11-26 DIAGNOSIS — E538 Deficiency of other specified B group vitamins: Secondary | ICD-10-CM | POA: Diagnosis not present

## 2023-12-14 ENCOUNTER — Emergency Department (HOSPITAL_COMMUNITY)
Admission: EM | Admit: 2023-12-14 | Discharge: 2023-12-14 | Disposition: A | Discharge status: Home / Self Care | Payer: Medicare Other | Attending: Emergency Medicine | Admitting: Emergency Medicine

## 2023-12-14 ENCOUNTER — Emergency Department (HOSPITAL_BASED_OUTPATIENT_CLINIC_OR_DEPARTMENT_OTHER): Payer: Medicare Other

## 2023-12-14 ENCOUNTER — Encounter (HOSPITAL_COMMUNITY): Payer: Self-pay

## 2023-12-14 ENCOUNTER — Other Ambulatory Visit: Payer: Self-pay

## 2023-12-14 DIAGNOSIS — M79605 Pain in left leg: Secondary | ICD-10-CM | POA: Diagnosis not present

## 2023-12-14 DIAGNOSIS — I10 Essential (primary) hypertension: Secondary | ICD-10-CM | POA: Insufficient documentation

## 2023-12-14 DIAGNOSIS — Z7982 Long term (current) use of aspirin: Secondary | ICD-10-CM | POA: Insufficient documentation

## 2023-12-14 DIAGNOSIS — M79662 Pain in left lower leg: Secondary | ICD-10-CM

## 2023-12-14 DIAGNOSIS — Z79899 Other long term (current) drug therapy: Secondary | ICD-10-CM | POA: Diagnosis not present

## 2023-12-14 NOTE — ED Provider Notes (Signed)
Gwendolyn [name] EMERGENCY DEPARTMENT AT [facility] Provider Note   CSN: [id number] Arrival date & time: 12/14/23  1053     History  Chief Complaint  Patient presents with   Leg Pain    Gwendolyn [name] is a 82 y.o. female.  HPI    82 year old patient comes in with chief complaint of leg discomfort.  Patient woke up this morning and noted some discomfort over her left calf area.  She has a friend who was recently diagnosed with blood clots, patient wanted to make sure she did not have it.  She denies any redness, swelling.  Patient has no previous history of DVT and denies any long distance travels, recent surgery or cancer history.   Home Medications Prior to Admission medications   Medication Sig Start Date End Date Taking? Authorizing Provider  aspirin 81 MG tablet Take 81 mg by mouth daily.      [provider]  Cholecalciferol (VITAMIN D) 1000 UNITS capsule Take 3,000 Units by mouth daily.     [provider]  Cyanocobalamin (B-12 COMPLIANCE INJECTION IJ) Inject 1 Dose as directed every 30 (thirty) days.    [provider]  FERREX 150 150 MG capsule Take 150 mg by mouth daily. 12/31/15   [provider]  folic acid (FOLVITE) 1 MG tablet Take 1 mg by mouth daily.      [provider]  furosemide (LASIX) 20 MG tablet Take 20 mg by mouth daily.    [provider]  HYDROcodone-acetaminophen (NORCO/VICODIN) 5-325 MG tablet Take 0.5-1 tablets by mouth every 6 (six) hours as needed. 10/18/15   Melton Krebs, PA-C  loperamide (IMODIUM) 2 MG capsule Take 4 mg by mouth as needed for diarrhea or loose stools.    [provider]  metoprolol (TOPROL-XL) 200 MG 24 hr tablet Take 200 mg by mouth daily.     [provider]  mupirocin ointment (BACTROBAN) 2 % Apply 1 application topically 2 (two) times daily. 10/16/15   [provider]  triamterene-hydrochlorothiazide (DYAZIDE) 37.5-25 MG per capsule  Take 1 capsule by mouth every morning.      [provider]  ZADITOR 0.025 % ophthalmic solution Place 1 drop into both eyes daily as needed (dry eyes).  10/03/15   [provider]      Allergies    Cyanocobalamin [vitamin b12], Tetanus toxoids, Codeine, Erythromycin, Other, Penicillins, and Sulfa antibiotics    Review of Systems   Review of Systems  All other systems reviewed and are negative.   Physical Exam Updated Vital Signs BP 125/60 (BP Location: Right Arm)   Pulse 60   Temp 98 F (36.7 C) (Oral)   Resp 13   Ht 5\' 5"  (1.651 m)   Wt 97.5 kg   SpO2 100%   BMI 35.78 kg/m  Physical Exam Vitals and nursing note reviewed.  Constitutional:      Appearance: She is well-developed.  HENT:     Head: Atraumatic.  Cardiovascular:     Rate and Rhythm: Normal rate.  Pulmonary:     Effort: Pulmonary effort is normal.  Musculoskeletal:        General: No swelling or tenderness.     Cervical back: Normal range of motion and neck supple.     Right lower leg: No edema.     Left lower leg: No edema.  Skin:    General: Skin is warm and dry.     Findings: No bruising or erythema.  Neurological:     Mental Status: She is alert and oriented to person, place, and time.     ED Results / Procedures / Treatments   Labs (all labs ordered are listed, but only abnormal results are displayed) Labs Reviewed - No data to display  EKG None  Radiology VAS [place] LOWER EXTREMITY VENOUS (DVT) Result Date: 12/14/2023  Lower Venous DVT Study Patient Name:  Gwendolyn [name]  Date of Exam:   12/14/2023 Medical Rec #: [id number]     Accession #:    [id number] Date of Birth: November 04, 1941    Patient Gender: F Patient Age:   15 years Exam Location:  [facility] Procedure:      VAS [place] LOWER EXTREMITY VENOUS (DVT) Referring Phys: Lorin Picket [place] --------------------------------------------------------------------------------  Indications: Pain.  Risk Factors: Past pregnancy. Comparison  Study: No significant changes seen since previous exam 10/18/15. Performing Technologist: Shona Simpson  Examination Guidelines: A complete evaluation includes B-mode imaging, spectral Doppler, color Doppler, and power Doppler as needed of all accessible portions of each vessel. Bilateral testing is considered an integral part of a complete examination. Limited examinations for reoccurring indications may be performed as noted. The reflux portion of the exam is performed with the patient in reverse Trendelenburg.  +-----+---------------+---------+-----------+----------+--------------+ RIGHTCompressibilityPhasicitySpontaneityPropertiesThrombus Aging +-----+---------------+---------+-----------+----------+--------------+ CFV  Full           Yes      Yes                                 +-----+---------------+---------+-----------+----------+--------------+   +---------+---------------+---------+-----------+----------+--------------+ LEFT     CompressibilityPhasicitySpontaneityPropertiesThrombus Aging +---------+---------------+---------+-----------+----------+--------------+ CFV      Full           Yes      Yes                                 +---------+---------------+---------+-----------+----------+--------------+ SFJ      Full                                                        +---------+---------------+---------+-----------+----------+--------------+ FV Prox  Full                                                        +---------+---------------+---------+-----------+----------+--------------+ FV Mid   Full                                                        +---------+---------------+---------+-----------+----------+--------------+ FV DistalFull                                                        +---------+---------------+---------+-----------+----------+--------------+ PFV      Full                                                         +---------+---------------+---------+-----------+----------+--------------+  POP      Full           Yes      Yes                                 +---------+---------------+---------+-----------+----------+--------------+ PTV      Full                                                        +---------+---------------+---------+-----------+----------+--------------+ PERO     Full                                                        +---------+---------------+---------+-----------+----------+--------------+     Summary: RIGHT: - No evidence of common femoral vein obstruction.   LEFT: - There is no evidence of deep vein thrombosis in the lower extremity.  - No cystic structure found in the popliteal fossa.  *See table(s) above for measurements and observations.    Preliminary     Procedures Procedures    Medications Ordered in ED Medications - No data to display  ED Course/ Medical Decision Making/ A&P                                 Medical Decision Making  82 year old patient comes in with chief complaint of leg discomfort. She has history of A-fib, hypertension not on any anticoagulation.  Differential diagnosis considered for her includes acute DVT, Baker's cyst, superficial thrombophlebitis.  No clinical evidence of cellulitis on exam.  No pitting edema/venous stasis.  Ultrasound DVT ordered and it is negative.  Ultrasound was independently interpreted as well, and there is no clear evidence of DVT.  Stable for discharge at this point.   Final Clinical Impression(s) / ED Diagnoses Final diagnoses:  Left leg pain    Rx / DC Orders ED Discharge Orders     None         Derwood Kaplan, MD 12/14/23 1631

## 2023-12-14 NOTE — ED Triage Notes (Signed)
Pt woke up this AM with left lower leg pain. Pt states she is concerned for blood clot. Denies redness or swelling in leg. Has afib, does not take blood thinners.

## 2023-12-14 NOTE — Progress Notes (Signed)
Lower extremity venous duplex completed. Please see CV Procedures for preliminary results.  Shona Simpson, RVT 12/14/23 2:36 PM

## 2023-12-14 NOTE — Discharge Instructions (Signed)
The blood clot study in the emergency room is reassuring.  No evidence of superficial or deep clots and no evidence of any cyst. Continue with normal activity.  Return to the ER if you start having significant swelling or pain or associated redness to the leg.

## 2024-01-01 DIAGNOSIS — E538 Deficiency of other specified B group vitamins: Secondary | ICD-10-CM | POA: Diagnosis not present

## 2024-02-03 DIAGNOSIS — E538 Deficiency of other specified B group vitamins: Secondary | ICD-10-CM | POA: Diagnosis not present

## 2024-02-18 DIAGNOSIS — E1169 Type 2 diabetes mellitus with other specified complication: Secondary | ICD-10-CM | POA: Diagnosis not present

## 2024-02-18 DIAGNOSIS — I10 Essential (primary) hypertension: Secondary | ICD-10-CM | POA: Diagnosis not present

## 2024-02-18 DIAGNOSIS — E538 Deficiency of other specified B group vitamins: Secondary | ICD-10-CM | POA: Diagnosis not present

## 2024-02-18 DIAGNOSIS — E782 Mixed hyperlipidemia: Secondary | ICD-10-CM | POA: Diagnosis not present

## 2024-03-22 DIAGNOSIS — E782 Mixed hyperlipidemia: Secondary | ICD-10-CM | POA: Diagnosis not present

## 2024-03-22 DIAGNOSIS — E559 Vitamin D deficiency, unspecified: Secondary | ICD-10-CM | POA: Diagnosis not present

## 2024-03-22 DIAGNOSIS — E611 Iron deficiency: Secondary | ICD-10-CM | POA: Diagnosis not present

## 2024-03-22 DIAGNOSIS — M109 Gout, unspecified: Secondary | ICD-10-CM | POA: Diagnosis not present

## 2024-03-22 DIAGNOSIS — E119 Type 2 diabetes mellitus without complications: Secondary | ICD-10-CM | POA: Diagnosis not present

## 2024-03-22 DIAGNOSIS — E876 Hypokalemia: Secondary | ICD-10-CM | POA: Diagnosis not present

## 2024-03-22 DIAGNOSIS — E538 Deficiency of other specified B group vitamins: Secondary | ICD-10-CM | POA: Diagnosis not present

## 2024-03-22 DIAGNOSIS — I10 Essential (primary) hypertension: Secondary | ICD-10-CM | POA: Diagnosis not present

## 2024-03-22 DIAGNOSIS — Z0000 Encounter for general adult medical examination without abnormal findings: Secondary | ICD-10-CM | POA: Diagnosis not present

## 2024-03-22 DIAGNOSIS — D510 Vitamin B12 deficiency anemia due to intrinsic factor deficiency: Secondary | ICD-10-CM | POA: Diagnosis not present

## 2024-03-31 DIAGNOSIS — E538 Deficiency of other specified B group vitamins: Secondary | ICD-10-CM | POA: Diagnosis not present

## 2024-05-07 DIAGNOSIS — E538 Deficiency of other specified B group vitamins: Secondary | ICD-10-CM | POA: Diagnosis not present

## 2024-05-27 DIAGNOSIS — R809 Proteinuria, unspecified: Secondary | ICD-10-CM | POA: Diagnosis not present

## 2024-05-27 DIAGNOSIS — I10 Essential (primary) hypertension: Secondary | ICD-10-CM | POA: Diagnosis not present

## 2024-05-27 DIAGNOSIS — E1165 Type 2 diabetes mellitus with hyperglycemia: Secondary | ICD-10-CM | POA: Diagnosis not present

## 2024-06-11 DIAGNOSIS — E538 Deficiency of other specified B group vitamins: Secondary | ICD-10-CM | POA: Diagnosis not present

## 2024-06-23 DIAGNOSIS — E1165 Type 2 diabetes mellitus with hyperglycemia: Secondary | ICD-10-CM | POA: Diagnosis not present

## 2024-06-23 DIAGNOSIS — I10 Essential (primary) hypertension: Secondary | ICD-10-CM | POA: Diagnosis not present

## 2024-06-28 DIAGNOSIS — I10 Essential (primary) hypertension: Secondary | ICD-10-CM | POA: Diagnosis not present

## 2024-06-28 DIAGNOSIS — E1165 Type 2 diabetes mellitus with hyperglycemia: Secondary | ICD-10-CM | POA: Diagnosis not present

## 2024-07-08 DIAGNOSIS — N390 Urinary tract infection, site not specified: Secondary | ICD-10-CM | POA: Diagnosis not present

## 2024-07-08 DIAGNOSIS — K5900 Constipation, unspecified: Secondary | ICD-10-CM | POA: Diagnosis not present

## 2024-07-23 DIAGNOSIS — E611 Iron deficiency: Secondary | ICD-10-CM | POA: Diagnosis not present

## 2024-07-23 DIAGNOSIS — E538 Deficiency of other specified B group vitamins: Secondary | ICD-10-CM | POA: Diagnosis not present

## 2024-07-23 DIAGNOSIS — E559 Vitamin D deficiency, unspecified: Secondary | ICD-10-CM | POA: Diagnosis not present

## 2024-07-23 DIAGNOSIS — D510 Vitamin B12 deficiency anemia due to intrinsic factor deficiency: Secondary | ICD-10-CM | POA: Diagnosis not present

## 2024-07-23 DIAGNOSIS — E119 Type 2 diabetes mellitus without complications: Secondary | ICD-10-CM | POA: Diagnosis not present

## 2024-07-29 DIAGNOSIS — I10 Essential (primary) hypertension: Secondary | ICD-10-CM | POA: Diagnosis not present

## 2024-07-29 DIAGNOSIS — R7303 Prediabetes: Secondary | ICD-10-CM | POA: Diagnosis not present

## 2024-07-29 DIAGNOSIS — Z6836 Body mass index (BMI) 36.0-36.9, adult: Secondary | ICD-10-CM | POA: Diagnosis not present

## 2024-07-29 DIAGNOSIS — E559 Vitamin D deficiency, unspecified: Secondary | ICD-10-CM | POA: Diagnosis not present

## 2024-07-29 DIAGNOSIS — E538 Deficiency of other specified B group vitamins: Secondary | ICD-10-CM | POA: Diagnosis not present

## 2024-08-04 DIAGNOSIS — I10 Essential (primary) hypertension: Secondary | ICD-10-CM | POA: Diagnosis not present

## 2024-08-04 DIAGNOSIS — E1165 Type 2 diabetes mellitus with hyperglycemia: Secondary | ICD-10-CM | POA: Diagnosis not present

## 2024-08-10 DIAGNOSIS — M1990 Unspecified osteoarthritis, unspecified site: Secondary | ICD-10-CM | POA: Diagnosis not present

## 2024-08-10 DIAGNOSIS — M109 Gout, unspecified: Secondary | ICD-10-CM | POA: Diagnosis not present

## 2024-08-10 DIAGNOSIS — Z79899 Other long term (current) drug therapy: Secondary | ICD-10-CM | POA: Diagnosis not present

## 2024-08-10 DIAGNOSIS — M720 Palmar fascial fibromatosis [Dupuytren]: Secondary | ICD-10-CM | POA: Diagnosis not present

## 2024-08-12 DIAGNOSIS — Z6835 Body mass index (BMI) 35.0-35.9, adult: Secondary | ICD-10-CM | POA: Diagnosis not present

## 2024-08-12 DIAGNOSIS — N949 Unspecified condition associated with female genital organs and menstrual cycle: Secondary | ICD-10-CM | POA: Diagnosis not present

## 2024-08-12 DIAGNOSIS — N898 Other specified noninflammatory disorders of vagina: Secondary | ICD-10-CM | POA: Diagnosis not present

## 2024-08-12 DIAGNOSIS — M549 Dorsalgia, unspecified: Secondary | ICD-10-CM | POA: Diagnosis not present

## 2024-08-25 DIAGNOSIS — N952 Postmenopausal atrophic vaginitis: Secondary | ICD-10-CM | POA: Diagnosis not present

## 2024-08-25 DIAGNOSIS — R8271 Bacteriuria: Secondary | ICD-10-CM | POA: Diagnosis not present

## 2024-08-25 DIAGNOSIS — R8279 Other abnormal findings on microbiological examination of urine: Secondary | ICD-10-CM | POA: Diagnosis not present

## 2024-08-30 DIAGNOSIS — E538 Deficiency of other specified B group vitamins: Secondary | ICD-10-CM | POA: Diagnosis not present

## 2024-09-01 DIAGNOSIS — E1165 Type 2 diabetes mellitus with hyperglycemia: Secondary | ICD-10-CM | POA: Diagnosis not present

## 2024-09-01 DIAGNOSIS — M109 Gout, unspecified: Secondary | ICD-10-CM | POA: Diagnosis not present

## 2024-09-08 DIAGNOSIS — N950 Postmenopausal bleeding: Secondary | ICD-10-CM | POA: Diagnosis not present

## 2024-09-08 DIAGNOSIS — Z124 Encounter for screening for malignant neoplasm of cervix: Secondary | ICD-10-CM | POA: Diagnosis not present

## 2024-09-08 DIAGNOSIS — E1165 Type 2 diabetes mellitus with hyperglycemia: Secondary | ICD-10-CM | POA: Diagnosis not present

## 2024-09-08 DIAGNOSIS — I10 Essential (primary) hypertension: Secondary | ICD-10-CM | POA: Diagnosis not present

## 2024-09-08 DIAGNOSIS — N952 Postmenopausal atrophic vaginitis: Secondary | ICD-10-CM | POA: Diagnosis not present

## 2024-09-27 DIAGNOSIS — E538 Deficiency of other specified B group vitamins: Secondary | ICD-10-CM | POA: Diagnosis not present

## 2024-10-05 ENCOUNTER — Ambulatory Visit (INDEPENDENT_AMBULATORY_CARE_PROVIDER_SITE_OTHER)

## 2024-10-05 DIAGNOSIS — E1142 Type 2 diabetes mellitus with diabetic polyneuropathy: Secondary | ICD-10-CM | POA: Diagnosis not present

## 2024-10-05 DIAGNOSIS — L84 Corns and callosities: Secondary | ICD-10-CM | POA: Diagnosis not present

## 2024-10-05 DIAGNOSIS — B351 Tinea unguium: Secondary | ICD-10-CM

## 2024-10-05 NOTE — Progress Notes (Signed)
 Subjective:  Patient ID: Gwendolyn [name], female    DOB: [birth year],  MRN: [id number]  83 y.o. female presents for preventative diabetic foot care. She relates to painful mycotic toenails x 10 which interfere with daily activities. She relates to minor burning and tingling in her feet.   Chief Complaint  Patient presents with   Callouses    Rm13 Patient complains of pain thick and hard toenails and requesting nails cut and trimmed      PCP is Okey Carlin Redbird, MD  Allergies  Allergen Reactions   Cyanocobalamin  [Vitamin B12] Rash    Pt developed a red rash on her nose and cheeks after the IM injection   Tetanus Toxoid-Containing Vaccines Swelling    Swollen arms at the injection site per patient.   Codeine Itching   Erythromycin Other (See Comments)    Increased heart rate   Other Other (See Comments)    ALL MYCINS- increased heart rate   Penicillins Itching    Has patient had a PCN reaction causing immediate rash, facial/tongue/throat swelling, SOB or lightheadedness with hypotension: no Has patient had a PCN reaction causing severe rash involving mucus membranes or skin necrosis: no Has patient had a PCN reaction that required hospitalization no Has patient had a PCN reaction occurring within the last 10 years: no If all of the above answers are NO, then may proceed with Cephalosporin use.    Sulfa Antibiotics Nausea And Vomiting    Review of Systems: Negative except as noted in the HPI.   Objective:  Gwendolyn [name] is a pleasant 83 y.o. female in NAD. AAO x 3. AAO x 3.  Vascular Examination: Vascular status intact b/l with palpable pedal pulses. CFT immediate b/l. Pedal hair present. No edema. No pain with calf compression b/l. Skin temperature gradient WNL b/l. No varicosities noted. No cyanosis or clubbing noted.  Neurological Examination: Sensation grossly intact b/l with 10 gram monofilament. Negative tinel sign at tarsal tunnel bilaterally.   Dermatological  Examination: Pedal skin with normal turgor, texture and tone b/l. No open wounds nor interdigital macerations noted. Toenails 1-5 b/l thick, discolored, elongated with subungual debris and pain on dorsal palpation. Hyperkeratotic lesion without central core noted at right hallux IPJ.   Musculoskeletal Examination: Muscle strength 5/5 to b/l LE.  No pain, crepitus noted b/l. No gross pedal deformities. Patient ambulates independently without assistive aids.   Radiographs: None  Assessment:   1. Diabetic polyneuropathy associated with type 2 diabetes mellitus (HCC)   2. Corns and callosities   3. Onychomycosis     Plan:  Patient was evaluated and treated. All patient's and/or POA's questions/concerns addressed on today's visit. Toenails 1-5 debrided in length and girth without incident. Continue foot and shoe inspections daily. Monitor blood glucose per PCP/Endocrinologist's recommendations. Continue soft, supportive shoe gear daily. Report any pedal injuries to medical professional. Call office if there are any questions/concerns. -Patient/POA to call should there be question/concern in the interim. - Hallux medial IPJ hyperkeratotic lesion debrided without incident  F/u in 3 months for nail debridement or 1 year for annual diabetic exam   Prentice Ovens, DPM AACFAS Fellowship Trained Podiatric Surgeon [facility]       [place] LOCATION: [address]  [place], [place] [address]                   Office [phone]   [facility] LOCATION: [address] [place], [place] [address] Office [phone]

## 2024-10-25 DIAGNOSIS — E538 Deficiency of other specified B group vitamins: Secondary | ICD-10-CM | POA: Diagnosis not present

## 2024-11-26 DIAGNOSIS — E538 Deficiency of other specified B group vitamins: Secondary | ICD-10-CM | POA: Diagnosis not present
